# Patient Record
Sex: Female | Born: 1960
Health system: Southern US, Community
[De-identification: ages and names within clinical notes are randomized; demographics above are authoritative.]

## PROBLEM LIST (undated history)

## (undated) DIAGNOSIS — D219 Benign neoplasm of connective and other soft tissue, unspecified: Secondary | ICD-10-CM

## (undated) DIAGNOSIS — D649 Anemia, unspecified: Secondary | ICD-10-CM

## (undated) DIAGNOSIS — IMO0002 Reserved for concepts with insufficient information to code with codable children: Secondary | ICD-10-CM

## (undated) DIAGNOSIS — N952 Postmenopausal atrophic vaginitis: Secondary | ICD-10-CM

## (undated) DIAGNOSIS — N200 Calculus of kidney: Secondary | ICD-10-CM

## (undated) DIAGNOSIS — Z780 Asymptomatic menopausal state: Secondary | ICD-10-CM

## (undated) DIAGNOSIS — Z87442 Personal history of urinary calculi: Secondary | ICD-10-CM

## (undated) HISTORY — DX: Benign neoplasm of connective and other soft tissue, unspecified: D21.9

## (undated) HISTORY — DX: Reserved for concepts with insufficient information to code with codable children: IMO0002

## (undated) HISTORY — DX: Calculus of kidney: N20.0

## (undated) HISTORY — DX: Postmenopausal atrophic vaginitis: N95.2

## (undated) HISTORY — PX: LITHOTRIPSY: SUR834

## (undated) HISTORY — DX: Anemia, unspecified: D64.9

## (undated) HISTORY — DX: Asymptomatic menopausal state: Z78.0

---

## 2004-10-14 HISTORY — PX: ABDOMINAL HYSTERECTOMY: SHX81

## 2005-07-15 ENCOUNTER — Inpatient Hospital Stay: Payer: Self-pay | Admitting: Obstetrics and Gynecology

## 2005-10-11 ENCOUNTER — Emergency Department: Payer: Self-pay | Admitting: Emergency Medicine

## 2005-11-28 ENCOUNTER — Ambulatory Visit: Payer: Self-pay | Admitting: Urology

## 2006-01-02 ENCOUNTER — Ambulatory Visit: Payer: Self-pay | Admitting: Urology

## 2006-04-29 ENCOUNTER — Ambulatory Visit: Payer: Self-pay | Admitting: Urology

## 2006-04-30 ENCOUNTER — Ambulatory Visit: Payer: Self-pay | Admitting: Urology

## 2006-05-26 ENCOUNTER — Ambulatory Visit: Payer: Self-pay | Admitting: Urology

## 2006-06-18 ENCOUNTER — Ambulatory Visit: Payer: Self-pay | Admitting: Obstetrics and Gynecology

## 2006-07-01 ENCOUNTER — Ambulatory Visit: Payer: Self-pay | Admitting: Urology

## 2006-08-12 ENCOUNTER — Ambulatory Visit: Payer: Self-pay | Admitting: Urology

## 2006-09-17 ENCOUNTER — Ambulatory Visit: Payer: Self-pay | Admitting: Urology

## 2007-06-22 ENCOUNTER — Ambulatory Visit: Payer: Self-pay | Admitting: Obstetrics and Gynecology

## 2007-10-13 ENCOUNTER — Ambulatory Visit: Payer: Self-pay | Admitting: Urology

## 2007-10-15 LAB — HM PAP SMEAR

## 2008-06-22 ENCOUNTER — Ambulatory Visit: Payer: Self-pay | Admitting: Obstetrics and Gynecology

## 2009-06-26 ENCOUNTER — Ambulatory Visit: Payer: Self-pay | Admitting: Obstetrics and Gynecology

## 2009-07-05 ENCOUNTER — Ambulatory Visit: Payer: Self-pay | Admitting: Obstetrics and Gynecology

## 2010-07-10 ENCOUNTER — Ambulatory Visit: Payer: Self-pay | Admitting: Obstetrics and Gynecology

## 2011-08-01 ENCOUNTER — Ambulatory Visit: Payer: Self-pay | Admitting: Obstetrics and Gynecology

## 2012-08-04 ENCOUNTER — Ambulatory Visit: Payer: Self-pay | Admitting: Obstetrics and Gynecology

## 2013-08-11 ENCOUNTER — Ambulatory Visit: Payer: Self-pay | Admitting: Obstetrics and Gynecology

## 2014-08-17 ENCOUNTER — Ambulatory Visit: Payer: Self-pay | Admitting: Obstetrics and Gynecology

## 2014-08-17 LAB — HM MAMMOGRAPHY

## 2015-07-18 ENCOUNTER — Ambulatory Visit (INDEPENDENT_AMBULATORY_CARE_PROVIDER_SITE_OTHER): Payer: Self-pay | Admitting: Obstetrics and Gynecology

## 2015-07-18 ENCOUNTER — Encounter: Payer: Self-pay | Admitting: Obstetrics and Gynecology

## 2015-07-18 VITALS — BP 120/74 | HR 76 | Ht 63.0 in | Wt 126.0 lb

## 2015-07-18 DIAGNOSIS — Z1211 Encounter for screening for malignant neoplasm of colon: Secondary | ICD-10-CM

## 2015-07-18 DIAGNOSIS — Z780 Asymptomatic menopausal state: Secondary | ICD-10-CM

## 2015-07-18 DIAGNOSIS — Z01419 Encounter for gynecological examination (general) (routine) without abnormal findings: Secondary | ICD-10-CM

## 2015-07-18 DIAGNOSIS — N952 Postmenopausal atrophic vaginitis: Secondary | ICD-10-CM | POA: Insufficient documentation

## 2015-07-18 NOTE — Progress Notes (Signed)
ANNUAL PREVENTATIVE CARE GYN  ENCOUNTER NOTE  Subjective:       Lindsay Howell is a 54 y.o. G18P0020 female here for a routine annual gynecologic exam.  Current complaints: 1.  Annual-Patient needs mammogram and stool Guaiac card testing (patient desires to proceed with colonoscopy evaluation this year); Patient does not need Pap smear   Gynecologic History No LMP recorded. Patient has had a hysterectomy.TAH/BSO Contraception: hysterectomy (TAH-FIBROIDS) Last Pap: 2009, no further paps needed. Last mammogram: 08/17/2014 [facility] 1 negative  Obstetric History OB History  Gravida Para Term Preterm AB SAB TAB Ectopic Multiple Living  2    2 2         # Outcome Date GA Lbr Len/2nd Weight Sex Delivery Anes PTL Lv  2 SAB         FD  1 SAB         FD      Past Medical History  Diagnosis Date  . Fibroid   . Nephrolithiasis   . Anemia   . Menopause   . Vaginal atrophy   . Dyspareunia   . Postmenopausal atrophic vaginitis     Past Surgical History  Procedure Laterality Date  . Abdominal hysterectomy  2006  . Lithotripsy      Current Outpatient Prescriptions on File Prior to Visit  Medication Sig Dispense Refill  . Ospemifene (OSPHENA) 60 MG TABS Take by mouth.     No current facility-administered medications on file prior to visit.    Allergies no known allergies  Social History   Social History  . Marital Status: Married    Spouse Name: N/A  . Number of Children: N/A  . Years of Education: N/A   Occupational History  .  [employer]   Social History Main Topics  . Smoking status: Never Smoker   . Smokeless tobacco: Never Used  . Alcohol Use: 0.0 oz/week    0 Standard drinks or equivalent per week  . Drug Use: No  . Sexual Activity: Yes    Birth Control/ Protection: Post-menopausal   Other Topics Concern  . Not on file   Social History Narrative    Family History  Problem Relation Age of Onset  . Cancer Neg Hx   . Diabetes Neg Hx   . Heart disease Neg Hx      The following portions of the patient's history were reviewed and updated as appropriate: allergies, current medications, past family history, past medical history, past social history, past surgical history and problem list.  Review of Systems ROS Review of Systems - General ROS: negative for - chills, fatigue, fever, hot flashes, night sweats, weight gain or weight loss Psychological ROS: negative for - anxiety, decreased libido, depression, mood swings, physical abuse or sexual abuse Ophthalmic ROS: negative for - blurry vision, eye pain or loss of vision ENT ROS: negative for - headaches, hearing change, visual changes or vocal changes Allergy and Immunology ROS: negative for - hives, itchy/watery eyes or seasonal allergies Hematological and Lymphatic ROS: negative for - bleeding problems, bruising, swollen lymph nodes or weight loss Endocrine ROS: negative for - galactorrhea, hair pattern changes, hot flashes, malaise/lethargy, mood swings, palpitations, polydipsia/polyuria, skin changes, temperature intolerance or unexpected weight changes Breast ROS: negative for - new or changing breast lumps or nipple discharge Respiratory ROS: negative for - cough or shortness of breath Cardiovascular ROS: negative for - chest pain, irregular heartbeat, palpitations or shortness of breath Gastrointestinal ROS: no abdominal pain, change in bowel habits, or black  or bloody stools Genito-Urinary ROS: no dysuria, trouble voiding, or hematuria Musculoskeletal ROS: negative for - joint pain or joint stiffness Neurological ROS: negative for - bowel and bladder control changes Dermatological ROS: negative for rash and skin lesion changes   Objective:   BP 120/74 mmHg  Pulse 76  Ht 5\' 3"  (1.6 m)  Wt 126 lb (57.153 kg)  BMI 22.33 kg/m2 CONSTITUTIONAL: Well-developed, well-nourished female in no acute distress.  PSYCHIATRIC: Normal mood and affect. Normal behavior. Normal judgment and thought  content. [place]: Alert and oriented to person, place, and time. Normal muscle tone coordination. No cranial nerve deficit noted. HENT:  Normocephalic, atraumatic, External right and left ear normal. Oropharynx is clear and moist EYES: Conjunctivae and EOM are normal. Pupils are equal, round, and reactive to light. No scleral icterus.  NECK: Normal range of motion, supple, no masses.  Normal thyroid.  SKIN: Skin is warm and dry. No rash noted. Not diaphoretic. No erythema. No pallor. CARDIOVASCULAR: Normal heart rate noted, regular rhythm, no murmur. RESPIRATORY: Clear to auscultation bilaterally. Effort and breath sounds normal, no problems with respiration noted. BREASTS: Symmetric in size. No masses, skin changes, nipple drainage, or lymphadenopathy. ABDOMEN: Soft, normal bowel sounds, no distention noted.  No tenderness, rebound or guarding.  BLADDER: Normal PELVIC:  External Genitalia: Normal  BUS: Normal  Vagina: Mild atrophic changes; vaginal cuff intact and nontender  Cervix: Surgically absent  Uterus: Surgically absent  Adnexa: Nonpalpable, nontender  RV: External Exam NormaI, No Rectal Masses and Normal Sphincter tone  MUSCULOSKELETAL: Normal range of motion. No tenderness.  No cyanosis, clubbing, or edema.  2+ distal pulses. LYMPHATIC: No Axillary, Supraclavicular, or Inguinal Adenopathy.    Assessment:   Annual gynecologic examination 54 y.o. Contraception: hysterectomy TAH/BSO Normal BMI Vaginal atrophy, minimally symptomatic, controlled with lubricants  Plan:  Pap: 2009 no further paps needed Mammogram: 08/17/2014 Stool Guaiac Testing:  Patient to proceed with colonoscopy Labs: done at work for screening, pt states everything normal Routine preventative health maintenance measures emphasized: Exercise/Diet/Weight control, Tobacco Warnings and Alcohol/Substance use risks Lubricants for vaginal dryness, when necessary.  (Oliive oil, astro glide, JO H2O gel Continue  calcium with vitamin D supplementation Return to [place], [place]  Brayton Mars, MD

## 2015-07-18 NOTE — Patient Instructions (Signed)
1.  No Pap smear needed. 2.  Mammogram 3.  Patient is to proceed with colonoscopy screening. 4.  Continue with calcium with vitamin D supplementation. 5.  Return in 1 year

## 2015-08-25 ENCOUNTER — Other Ambulatory Visit: Payer: Self-pay | Admitting: Obstetrics and Gynecology

## 2015-08-25 ENCOUNTER — Ambulatory Visit
Admission: RE | Admit: 2015-08-25 | Discharge: 2015-08-25 | Disposition: A | Discharge status: Home / Self Care | Payer: BLUE CROSS/BLUE SHIELD | Source: Ambulatory Visit | Attending: Obstetrics and Gynecology | Admitting: Obstetrics and Gynecology

## 2015-08-25 DIAGNOSIS — Z01419 Encounter for gynecological examination (general) (routine) without abnormal findings: Secondary | ICD-10-CM

## 2015-08-25 DIAGNOSIS — Z1231 Encounter for screening mammogram for malignant neoplasm of breast: Secondary | ICD-10-CM | POA: Diagnosis not present

## 2016-01-03 IMAGING — MG MM SCREENING BREAST TOMO BILATERAL
9 of 12 series · 9 of 28 positions shown · non-contrast
Comparison: Previous exam(s).

CLINICAL DATA: Screening.

EXAM:
DIGITAL SCREENING BILATERAL MAMMOGRAM WITH 3D TOMO WITH CAD

[R MLO synth-2D]
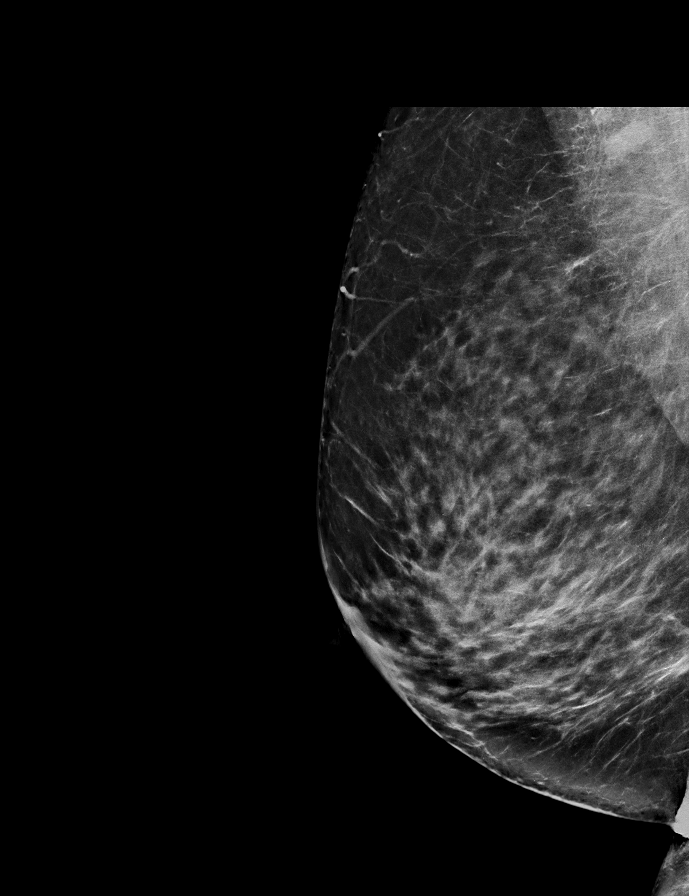

[R MLO]
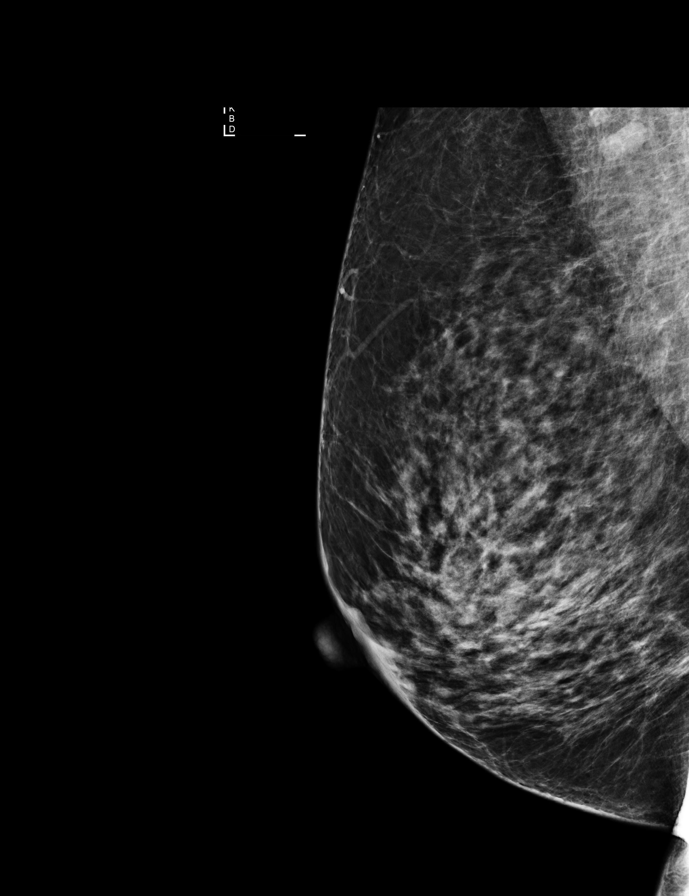

[R CC synth-2D]
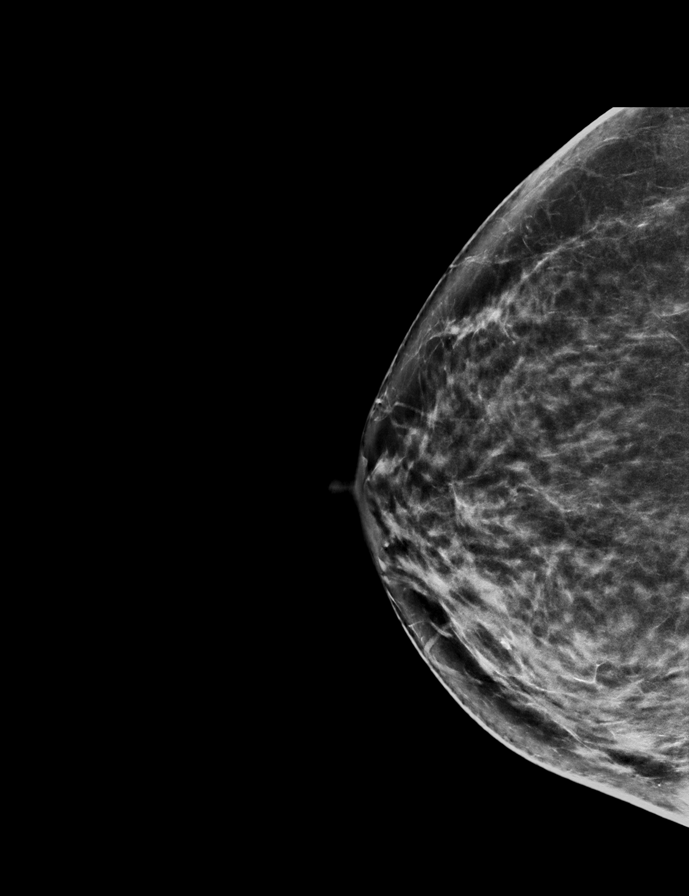

[R CC]
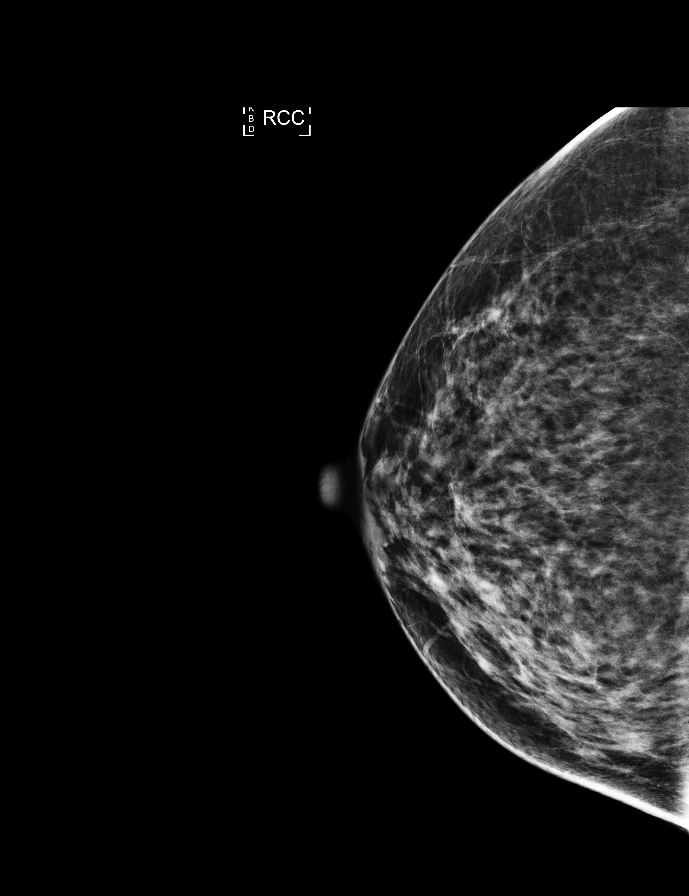

[L CC]
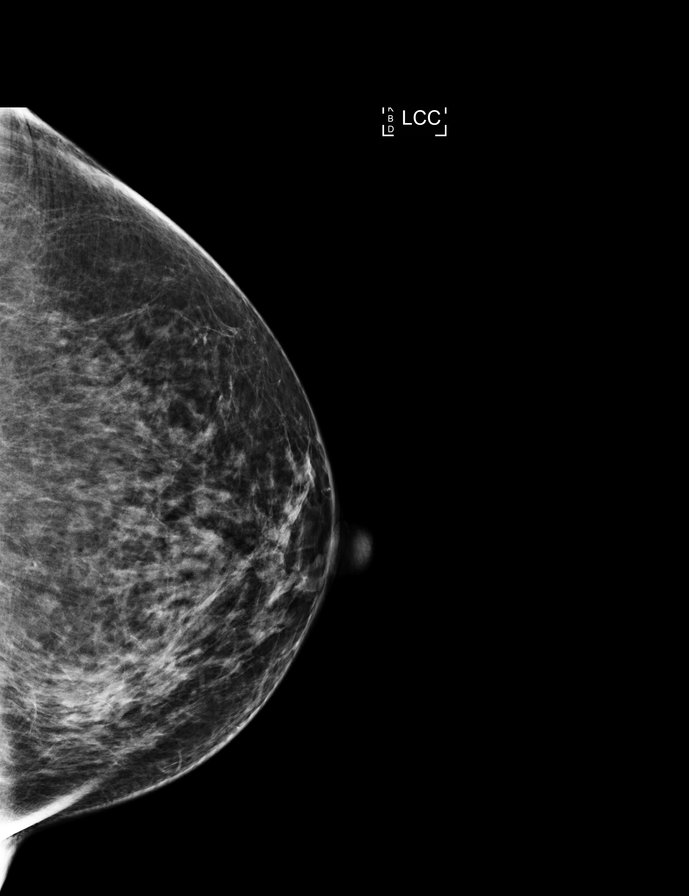

[L MLO]
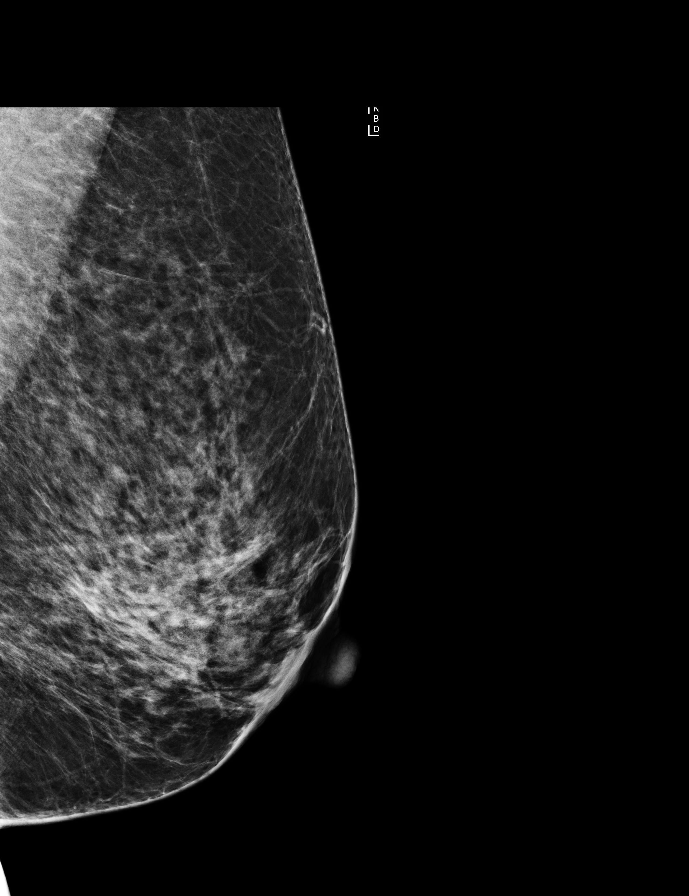

[L MLO synth-2D]
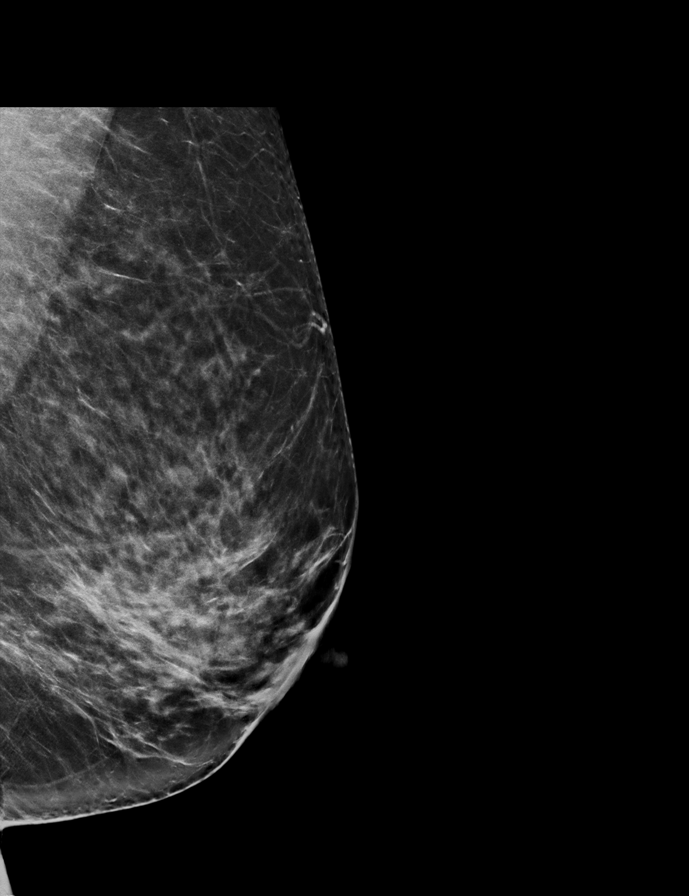

[L CC synth-2D]
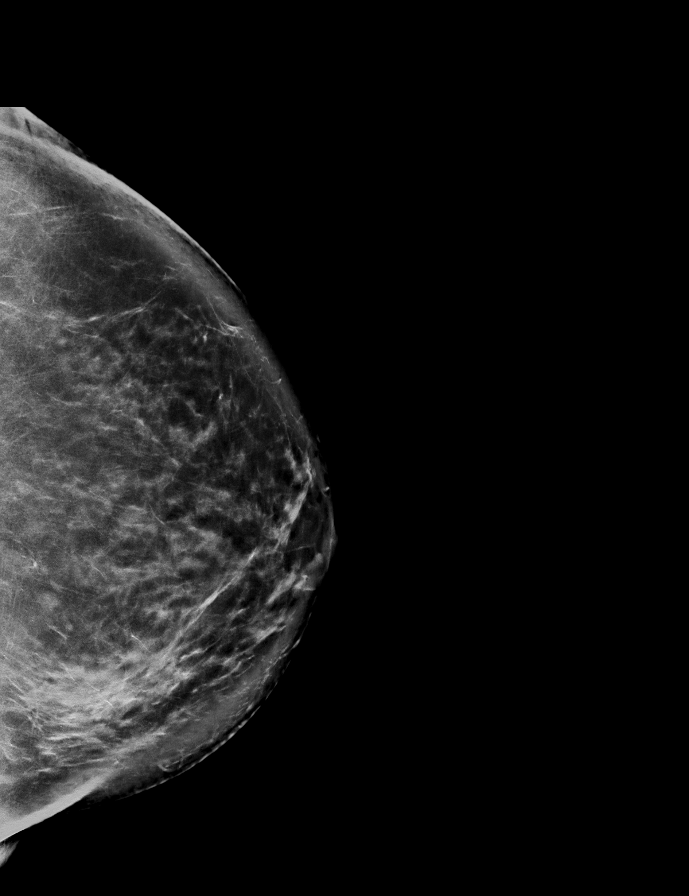

[R MLO tomo · tomo slice 39/78.0]
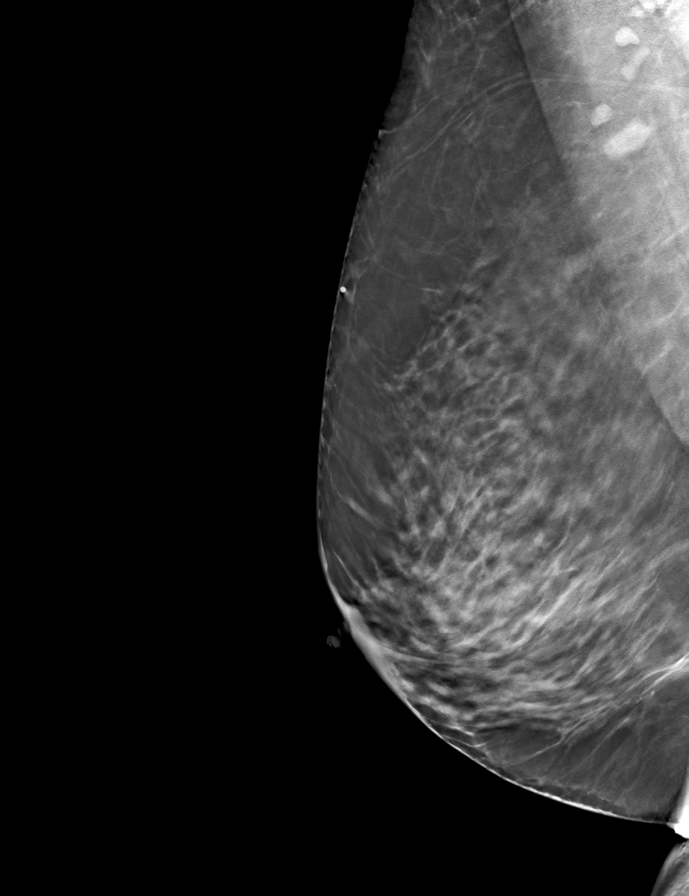

[9 of 28 positions shown; findings below may reference images not displayed]

ACR Breast Density Category c: The breast tissue is heterogeneously
dense, which may obscure small masses.
FINDINGS: There are no findings suspicious for malignancy. Images were
processed with CAD.
IMPRESSION: No mammographic evidence of malignancy. A result letter of this
screening mammogram will be mailed directly to the patient.

RECOMMENDATION:
Screening mammogram in one year. (Code:OA-G-1SS)

BI-RADS CATEGORY  1: Negative.

## 2016-07-22 NOTE — Progress Notes (Signed)
ANNUAL PREVENTATIVE CARE GYN  ENCOUNTER NOTE  Subjective:       Lindsay Howell is a 55 y.o. G16P0020 female here for a routine annual gynecologic exam.  Current complaints: 1.  Want to discuss starting osphena again- vaginal area raw   Gynecologic History No LMP recorded. Patient has had a hysterectomy. Contraception: status post hysterectomy- tah/bso Last Pap: no further paps needed. Results were: Last mammogram: 08/2015 birad 1. Results were: normal  Obstetric History OB History  Gravida Para Term Preterm AB Living  2       2    SAB TAB Ectopic Multiple Live Births  2            # Outcome Date GA Lbr Len/2nd Weight Sex Delivery Anes PTL Lv  2 SAB         FD  1 SAB         FD      Past Medical History:  Diagnosis Date  . Anemia   . Dyspareunia   . Fibroid   . Menopause   . Nephrolithiasis   . Postmenopausal atrophic vaginitis   . Vaginal atrophy     Past Surgical History:  Procedure Laterality Date  . ABDOMINAL HYSTERECTOMY  2006  . LITHOTRIPSY      Current Outpatient Prescriptions on File Prior to Visit  Medication Sig Dispense Refill  . Lactobacillus (ACIDOPHILUS) CAPS capsule Take by mouth.    . Ospemifene (OSPHENA) 60 MG TABS Take by mouth.     No current facility-administered medications on file prior to visit.     No Known Allergies  Social History   Social History  . Marital status: Married    Spouse name: N/A  . Number of children: N/A  . Years of education: N/A   Occupational History  .  [employer]   Social History Main Topics  . Smoking status: Never Smoker  . Smokeless tobacco: Never Used  . Alcohol use 0.0 oz/week  . Drug use: No  . Sexual activity: Yes    Birth control/ protection: Post-menopausal   Other Topics Concern  . Not on file   Social History Narrative  . No narrative on file    Family History  Problem Relation Age of Onset  . Cancer Neg Hx   . Diabetes Neg Hx   . Heart disease Neg Hx     The following portions of  the patient's history were reviewed and updated as appropriate: allergies, current medications, past family history, past medical history, past social history, past surgical history and problem list.  Review of Systems ROS Review of Systems - General ROS: negative for - chills, fatigue, fever, hot flashes, night sweats, weight gain or weight loss Psychological ROS: negative for - anxiety, decreased libido, depression, mood swings, physical abuse or sexual abuse Ophthalmic ROS: negative for - blurry vision, eye pain or loss of vision ENT ROS: negative for - headaches, hearing change, visual changes or vocal changes Allergy and Immunology ROS: negative for - hives, itchy/watery eyes or seasonal allergies Hematological and Lymphatic ROS: negative for - bleeding problems, bruising, swollen lymph nodes or weight loss Endocrine ROS: negative for - galactorrhea, hair pattern changes, hot flashes, malaise/lethargy, mood swings, palpitations, polydipsia/polyuria, skin changes, temperature intolerance or unexpected weight changes Breast ROS: negative for - new or changing breast lumps or nipple discharge Respiratory ROS: negative for - cough or shortness of breath Cardiovascular ROS: negative for - chest pain, irregular heartbeat, palpitations or shortness of breath Gastrointestinal  ROS: no abdominal pain, change in bowel habits, or black or bloody stools Genito-Urinary ROS: no dysuria, trouble voiding, or hematuria Musculoskeletal ROS: negative for - joint pain or joint stiffness Neurological ROS: negative for - bowel and bladder control changes Dermatological ROS: negative for rash and skin lesion changes   Objective:  BP 127/66   Pulse 70   Ht 5\' 3"  (1.6 m)   Wt 127 lb 9.6 oz (57.9 kg)   BMI 22.60 kg/m  CONSTITUTIONAL: Well-developed, well-nourished female in no acute distress.  PSYCHIATRIC: Normal mood and affect. Normal behavior. Normal judgment and thought content. [place]: Alert and  oriented to person, place, and time. Normal muscle tone coordination. No cranial nerve deficit noted. HENT:  Normocephalic, atraumatic, External right and left ear normal. Oropharynx is clear and moist EYES: Conjunctivae and EOM are normal.  No scleral icterus.  NECK: Normal range of motion, supple, no masses.  Normal thyroid.  SKIN: Skin is warm and dry. No rash noted. Not diaphoretic. No erythema. No pallor. CARDIOVASCULAR: Normal heart rate noted, regular rhythm, no murmur. RESPIRATORY: Clear to auscultation bilaterally. Effort and breath sounds normal, no problems with respiration noted. BREASTS: Symmetric in size. No masses, skin changes, nipple drainage, or lymphadenopathy. ABDOMEN: Soft, normal bowel sounds, no distention noted.  No tenderness, rebound or guarding.  BLADDER: Normal PELVIC:  External Genitalia: Normal  BUS: Normal  Vagina: Moderate atrophy; introitus snug, admitting single digit  Cervix: Surgically absent  Uterus: Surgically absent  Adnexa: Normal; nonpalpable nontender  RV: External Exam NormaI, No Rectal Masses and Normal Sphincter tone  MUSCULOSKELETAL: Normal range of motion. No tenderness.  No cyanosis, clubbing, or edema.  2+ distal pulses. LYMPHATIC: No Axillary, Supraclavicular, or Inguinal Adenopathy.    Assessment:   Annual gynecologic examination 55 y.o. Contraception: status post hysterectomy Normal BMI Problem List Items Addressed This Visit    Menopause   Vaginal atrophy    Other Visit Diagnoses    Well woman exam with routine gynecological exam    -  Primary   Screening for colon cancer         Vulvovaginal atrophy is symptomatic anemia; patient is counseled on dilator therapy and use of vaginal ERT Plan:  Pap: Not needed Mammogram: Ordered Stool Guaiac Testing:  Will have colonoscopy this year Labs: thru pcp Routine preventative health maintenance measures emphasized: Exercise/Diet/Weight control, Tobacco Warnings and Alcohol/Substance  use risks Start Premarin cream 1 g intravaginal twice a week Patient is to consider vaginal dilator therapy; www.vaginismus.com Return to [place], [place]  Brayton Mars, MD  Note: This dictation was prepared with Dragon dictation along with smaller phrase technology. Any transcriptional errors that result from this process are unintentional.

## 2016-07-23 ENCOUNTER — Encounter: Payer: BLUE CROSS/BLUE SHIELD | Admitting: Obstetrics and Gynecology

## 2016-07-24 ENCOUNTER — Ambulatory Visit (INDEPENDENT_AMBULATORY_CARE_PROVIDER_SITE_OTHER): Payer: BLUE CROSS/BLUE SHIELD | Admitting: Obstetrics and Gynecology

## 2016-07-24 ENCOUNTER — Encounter: Payer: Self-pay | Admitting: Obstetrics and Gynecology

## 2016-07-24 VITALS — BP 127/66 | HR 70 | Ht 63.0 in | Wt 127.6 lb

## 2016-07-24 DIAGNOSIS — Z90722 Acquired absence of ovaries, bilateral: Secondary | ICD-10-CM | POA: Diagnosis not present

## 2016-07-24 DIAGNOSIS — N952 Postmenopausal atrophic vaginitis: Secondary | ICD-10-CM | POA: Diagnosis not present

## 2016-07-24 DIAGNOSIS — Z90710 Acquired absence of both cervix and uterus: Secondary | ICD-10-CM | POA: Insufficient documentation

## 2016-07-24 DIAGNOSIS — Z1239 Encounter for other screening for malignant neoplasm of breast: Secondary | ICD-10-CM

## 2016-07-24 DIAGNOSIS — Z01419 Encounter for gynecological examination (general) (routine) without abnormal findings: Secondary | ICD-10-CM

## 2016-07-24 DIAGNOSIS — Z780 Asymptomatic menopausal state: Secondary | ICD-10-CM | POA: Diagnosis not present

## 2016-07-24 DIAGNOSIS — Z1211 Encounter for screening for malignant neoplasm of colon: Secondary | ICD-10-CM

## 2016-07-24 DIAGNOSIS — Z9079 Acquired absence of other genital organ(s): Secondary | ICD-10-CM | POA: Diagnosis not present

## 2016-07-24 DIAGNOSIS — N941 Unspecified dyspareunia: Secondary | ICD-10-CM

## 2016-07-24 DIAGNOSIS — Z1231 Encounter for screening mammogram for malignant neoplasm of breast: Secondary | ICD-10-CM | POA: Diagnosis not present

## 2016-07-24 NOTE — Patient Instructions (Signed)
1. No Pap smear is needed. 2. Mammogram is ordered. 3. Stool guaiac cards are not given. Patient will get colonoscopy this year 4. Healthy eating and exercise are to be continued 5. Calcium with vitamin D supplementation is encouraged daily 6. Trial of Premarin cream 1/2-1 g intravaginal is ordered 7. Vaginal dilator therapy website:  Www.vaginismus.com 8. Options for vaginal estrogen treatment:  Premarin cream 1 g intravaginal twice a week  Estrace cream 1 g intravaginal twice a week  Osphena 1 tablet daily  Estring intravaginal every 3 months 9. Return in 1 year

## 2016-07-30 ENCOUNTER — Telehealth: Payer: Self-pay | Admitting: Obstetrics and Gynecology

## 2016-07-30 MED ORDER — ESTROGENS, CONJUGATED 0.625 MG/GM VA CREA: 0.5000 | TOPICAL_CREAM | VAGINAL | 12 refills | Status: DC

## 2016-07-30 NOTE — Telephone Encounter (Signed)
This pt was told to call with RX info.  Send in [place]. [facility]

## 2016-07-30 NOTE — Telephone Encounter (Signed)
Med erx. Pt aware.  

## 2016-08-23 ENCOUNTER — Other Ambulatory Visit: Payer: Self-pay

## 2016-08-23 ENCOUNTER — Telehealth: Payer: Self-pay

## 2016-08-23 NOTE — Telephone Encounter (Signed)
Screening Colonoscopy Z12.11 [facility] 10/28/2016 Dr. Alvina Filbert Precert is not required

## 2016-08-23 NOTE — Telephone Encounter (Signed)
Gastroenterology Pre-Procedure Review  Request Date: 10/28/2016 Requesting Physician:   PATIENT REVIEW QUESTIONS: The patient responded to the following health history questions as indicated:    1. Are you having any GI issues? no 2. Do you have a personal history of Polyps? no 3. Do you have a family history of Colon Cancer or Polyps? no 4. Diabetes Mellitus? no 5. Joint replacements in the past 12 months?no 6. Major health problems in the past 3 months?no 7. Any artificial heart valves, MVP, or defibrillator?no    MEDICATIONS & ALLERGIES:    Patient reports the following regarding taking any anticoagulation/antiplatelet therapy:   Plavix, Coumadin, Eliquis, Xarelto, Lovenox, Pradaxa, Brilinta, or Effient? no Aspirin? no  Patient confirms/reports the following medications:  Current Outpatient Prescriptions  Medication Sig Dispense Refill  . conjugated estrogens (PREMARIN) vaginal cream Place 0.5 Applicatorfuls vaginally 2 (two) times a week. 1 gram vaginally twice weekly 42.5 g 12  . Lactobacillus (ACIDOPHILUS) CAPS capsule Take by mouth.     No current facility-administered medications for this visit.     Patient confirms/reports the following allergies:  No Known Allergies  No orders of the defined types were placed in this encounter.   AUTHORIZATION INFORMATION Primary Insurance: 1D#: Group #:  Secondary Insurance: 1D#: Group #:  SCHEDULE INFORMATION: Date: 10/28/2016 Time: Location: [place]

## 2016-08-26 ENCOUNTER — Ambulatory Visit
Admission: RE | Admit: 2016-08-26 | Discharge: 2016-08-26 | Disposition: A | Discharge status: Home / Self Care | Payer: BLUE CROSS/BLUE SHIELD | Source: Ambulatory Visit | Attending: Obstetrics and Gynecology | Admitting: Obstetrics and Gynecology

## 2016-08-26 ENCOUNTER — Encounter (INDEPENDENT_AMBULATORY_CARE_PROVIDER_SITE_OTHER): Payer: Self-pay

## 2016-08-26 DIAGNOSIS — Z1231 Encounter for screening mammogram for malignant neoplasm of breast: Secondary | ICD-10-CM | POA: Diagnosis not present

## 2016-08-26 DIAGNOSIS — Z1239 Encounter for other screening for malignant neoplasm of breast: Secondary | ICD-10-CM

## 2016-09-20 ENCOUNTER — Ambulatory Visit: Payer: BLUE CROSS/BLUE SHIELD

## 2016-10-04 DIAGNOSIS — J0100 Acute maxillary sinusitis, unspecified: Secondary | ICD-10-CM | POA: Diagnosis not present

## 2016-10-22 DIAGNOSIS — Z713 Dietary counseling and surveillance: Secondary | ICD-10-CM | POA: Diagnosis not present

## 2016-10-28 ENCOUNTER — Ambulatory Visit: Payer: BLUE CROSS/BLUE SHIELD | Admitting: Anesthesiology

## 2016-10-28 ENCOUNTER — Encounter: Payer: Self-pay | Admitting: *Deleted

## 2016-10-28 ENCOUNTER — Encounter
Admission: RE | Disposition: A | Discharge status: Home / Self Care | Payer: Self-pay | Source: Ambulatory Visit | Attending: Gastroenterology

## 2016-10-28 ENCOUNTER — Ambulatory Visit
Admission: RE | Admit: 2016-10-28 | Discharge: 2016-10-28 | Disposition: A | Discharge status: Home / Self Care | Payer: BLUE CROSS/BLUE SHIELD | Source: Ambulatory Visit | Attending: Gastroenterology | Admitting: Gastroenterology

## 2016-10-28 DIAGNOSIS — K648 Other hemorrhoids: Secondary | ICD-10-CM | POA: Diagnosis not present

## 2016-10-28 DIAGNOSIS — K219 Gastro-esophageal reflux disease without esophagitis: Secondary | ICD-10-CM | POA: Diagnosis not present

## 2016-10-28 DIAGNOSIS — Z79899 Other long term (current) drug therapy: Secondary | ICD-10-CM | POA: Diagnosis not present

## 2016-10-28 DIAGNOSIS — Z1211 Encounter for screening for malignant neoplasm of colon: Secondary | ICD-10-CM

## 2016-10-28 DIAGNOSIS — D649 Anemia, unspecified: Secondary | ICD-10-CM | POA: Diagnosis not present

## 2016-10-28 DIAGNOSIS — K640 First degree hemorrhoids: Secondary | ICD-10-CM | POA: Diagnosis not present

## 2016-10-28 HISTORY — DX: Personal history of urinary calculi: Z87.442

## 2016-10-28 HISTORY — PX: COLONOSCOPY WITH PROPOFOL: SHX5780

## 2016-10-28 SURGERY — COLONOSCOPY WITH PROPOFOL
Anesthesia: General

## 2016-10-28 MED ORDER — SODIUM CHLORIDE 0.9 % IV SOLN
INTRAVENOUS | Status: DC
  Administered 2016-10-28: 08:00:00 via INTRAVENOUS

## 2016-10-28 MED ORDER — MIDAZOLAM HCL 2 MG/2ML IJ SOLN
INTRAMUSCULAR | Status: AC
  Filled 2016-10-28: qty 2

## 2016-10-28 MED ORDER — PROPOFOL 500 MG/50ML IV EMUL
INTRAVENOUS | Status: AC
  Filled 2016-10-28: qty 50

## 2016-10-28 MED ORDER — PROPOFOL 500 MG/50ML IV EMUL
INTRAVENOUS | Status: DC | PRN
  Administered 2016-10-28: 125 ug/kg/min via INTRAVENOUS

## 2016-10-28 MED ORDER — LIDOCAINE HCL (PF) 1 % IJ SOLN
INTRAMUSCULAR | Status: AC
  Filled 2016-10-28: qty 5

## 2016-10-28 MED ORDER — PROPOFOL 10 MG/ML IV BOLUS
INTRAVENOUS | Status: DC | PRN
  Administered 2016-10-28: 50 mg via INTRAVENOUS

## 2016-10-28 MED ORDER — MIDAZOLAM HCL 2 MG/2ML IJ SOLN
INTRAMUSCULAR | Status: DC | PRN
  Administered 2016-10-28: 2 mg via INTRAVENOUS

## 2016-10-28 MED ORDER — LIDOCAINE HCL (PF) 2 % IJ SOLN
INTRAMUSCULAR | Status: DC | PRN
  Administered 2016-10-28: 60 mg via INTRADERMAL

## 2016-10-28 NOTE — Op Note (Signed)
[facility] Gastroenterology Patient Name: Lindsay Howell Procedure Date: 10/28/2016 7:20 AM MRN: [id number] Account #: [id number] Date of Birth: 06-Nov-1960 Admit Type: Outpatient Age: 56 Room: [facility] ENDO ROOM 4 Gender: Female Note Status: Finalized Procedure:            Colonoscopy Indications:          Screening for colorectal malignant neoplasm Providers:            Jonathon Bellows MD, MD Referring MD:         Alanda Slim. Defrancesco, MD (Referring MD) Medicines:            Monitored Anesthesia Care Complications:        No immediate complications. Procedure:            Pre-Anesthesia Assessment:                       - Prior to the procedure, a History and Physical was                        performed, and patient medications, allergies and                        sensitivities were reviewed. The patient's tolerance of                        previous anesthesia was reviewed.                       - The risks and benefits of the procedure and the                        sedation options and risks were discussed with the                        patient. All questions were answered and informed                        consent was obtained.                       - The risks and benefits of the procedure and the                        sedation options and risks were discussed with the                        patient. All questions were answered and informed                        consent was obtained.                       - ASA Grade Assessment: III - A patient with severe                        systemic disease.                       After obtaining informed consent, the colonoscope was  passed under direct vision. Throughout the procedure,                        the patient's blood pressure, pulse, and oxygen                        saturations were monitored continuously. The                        Colonoscope was introduced through the anus and                         advanced to the the cecum, identified by the                        appendiceal orifice, IC valve and transillumination.                        The colonoscopy was performed with ease. The patient                        tolerated the procedure well. The quality of the bowel                        preparation was excellent. Findings:      The perianal and digital rectal examinations were normal.      Non-bleeding internal hemorrhoids were found during retroflexion. The       hemorrhoids were small and Grade I (internal hemorrhoids that do not       prolapse).      The exam was otherwise without abnormality. Impression:           - Non-bleeding internal hemorrhoids.                       - The examination was otherwise normal.                       - No specimens collected. Recommendation:       - Patient has a contact number available for                        emergencies. The signs and symptoms of potential                        delayed complications were discussed with the patient.                        Return to normal activities tomorrow. Written discharge                        instructions were provided to the patient.                       - Resume previous diet.                       - Continue present medications.                       - Repeat colonoscopy in 10 years for screening purposes.                       -  Discharge patient to home (with escort). Procedure Code(s):    --- Professional ---                       [id number], Colorectal cancer screening; colonoscopy on                        individual not meeting criteria for high risk CPT copyright 2016 American Medical Association. All rights reserved. The codes documented in this report are preliminary and upon coder review may  be revised to meet current compliance requirements. Jonathon Bellows, MD Jonathon Bellows MD, MD 10/28/2016 8:15:24 AM This report has been signed electronically. Number of Addenda:  0 Note Initiated On: 10/28/2016 7:20 AM Scope Withdrawal Time: 0 hours 11 minutes 39 seconds  Total Procedure Duration: 0 hours 14 minutes 8 seconds       [facility]

## 2016-10-28 NOTE — H&P (Signed)
  Jonathon Bellows MD [address] [place], [place] [address] Phone: [phone] Fax : [phone]  Primary Care Physician:  Brayton Mars, MD Primary Gastroenterologist:  Dr. Jonathon Bellows   Pre-Procedure History & Physical: HPI:  Lindsay Howell is a 56 y.o. female is here for an colonoscopy.   Past Medical History:  Diagnosis Date  . Anemia   . Dyspareunia   . Fibroid   . History of kidney stones   . Menopause   . Nephrolithiasis   . Postmenopausal atrophic vaginitis   . Vaginal atrophy     Past Surgical History:  Procedure Laterality Date  . ABDOMINAL HYSTERECTOMY  2006  . LITHOTRIPSY      Prior to Admission medications   Medication Sig Start Date End Date Taking? Authorizing Provider  Lactobacillus (ACIDOPHILUS) CAPS capsule Take by mouth.   Yes Historical Provider, MD  conjugated estrogens (PREMARIN) vaginal cream Place 0.5 Applicatorfuls vaginally 2 (two) times a week. 1 gram vaginally twice weekly 08/01/16   Brayton Mars, MD    Allergies as of 08/23/2016  . (No Known Allergies)    Family History  Problem Relation Age of Onset  . Cancer Neg Hx   . Diabetes Neg Hx   . Heart disease Neg Hx   . Breast cancer Neg Hx     Social History   Social History  . Marital status: Married    Spouse name: N/A  . Number of children: N/A  . Years of education: N/A   Occupational History  .  [employer]   Social History Main Topics  . Smoking status: Never Smoker  . Smokeless tobacco: Never Used  . Alcohol use 0.0 oz/week     Comment: socially  . Drug use: No  . Sexual activity: Yes    Birth control/ protection: Post-menopausal   Other Topics Concern  . Not on file   Social History Narrative  . No narrative on file    Review of Systems: See HPI, otherwise negative ROS  Physical Exam: BP 130/69   Pulse 74   Temp 98.1 F (36.7 C) (Tympanic)   Resp 18   Ht 5\' 3"  (1.6 m)   Wt 127 lb (57.6 kg)   SpO2 100%   BMI 22.50 kg/m  General:    Alert,  pleasant and cooperative in NAD Head:  Normocephalic and atraumatic. Neck:  Supple; no masses or thyromegaly. Lungs:  Clear throughout to auscultation.    Heart:  Regular rate and rhythm. Abdomen:  Soft, nontender and nondistended. Normal bowel sounds, without guarding, and without rebound.   Neurologic:  Alert and  oriented x4;  grossly normal neurologically.  Impression/Plan: Lindsay Howell is here for an colonoscopy to be performed for Screening colonoscopy average risk   Risks, benefits, limitations, and alternatives regarding  colonoscopy have been reviewed with the patient.  Questions have been answered.  All parties agreeable.   Jonathon Bellows, MD  10/28/2016, 7:54 AM

## 2016-10-28 NOTE — Transfer of Care (Signed)
Immediate Anesthesia Transfer of Care Note  Patient: Lindsay Howell  Procedure(s) Performed: Procedure(s): COLONOSCOPY WITH PROPOFOL (N/A)  Patient Location: PACU  Anesthesia Type:General  Level of Consciousness: awake and responds to stimulation  Airway & Oxygen Therapy: Patient Spontanous Breathing and Patient connected to nasal cannula oxygen  Post-op Assessment: Report given to RN and Post -op Vital signs reviewed and stable  Post vital signs: Reviewed and stable  Last Vitals:  Vitals:   10/28/16 0727  BP: 130/69  Pulse: 74  Resp: 18  Temp: 36.7 C    Last Pain:  Vitals:   10/28/16 0727  TempSrc: Tympanic         Complications: No apparent anesthesia complications

## 2016-10-28 NOTE — Anesthesia Preprocedure Evaluation (Signed)
Anesthesia Evaluation  Patient identified by MRN, date of birth, ID band Patient awake    Reviewed: Allergy & Precautions, H&P , NPO status , Patient's Chart, lab work & pertinent test results  History of Anesthesia Complications Negative for: history of anesthetic complications  Airway Mallampati: I  TM Distance: >3 FB Neck ROM: full    Dental  (+) Poor Dentition, Caps   Pulmonary neg pulmonary ROS, neg shortness of breath,    Pulmonary exam normal breath sounds clear to auscultation       Cardiovascular Exercise Tolerance: Good (-) angina(-) Past MI and (-) DOE negative cardio ROS Normal cardiovascular exam Rhythm:regular Rate:Normal     Neuro/Psych negative neurological ROS  negative psych ROS   GI/Hepatic Neg liver ROS, GERD  Controlled,  Endo/Other  negative endocrine ROS  Renal/GU Renal disease  negative genitourinary   Musculoskeletal   Abdominal   Peds  Hematology negative hematology ROS (+)   Anesthesia Other Findings Past Medical History: No date: Anemia No date: Dyspareunia No date: Fibroid No date: History of kidney stones No date: Menopause No date: Nephrolithiasis No date: Postmenopausal atrophic vaginitis No date: Vaginal atrophy  Past Surgical History: 2006: ABDOMINAL HYSTERECTOMY No date: LITHOTRIPSY  BMI    Body Mass Index:  22.50 kg/m      Reproductive/Obstetrics negative OB ROS                             Anesthesia Physical Anesthesia Plan  ASA: III  Anesthesia Plan: General   Post-op Pain Management:    Induction:   Airway Management Planned:   Additional Equipment:   Intra-op Plan:   Post-operative Plan:   Informed Consent: I have reviewed the patients History and Physical, chart, labs and discussed the procedure including the risks, benefits and alternatives for the proposed anesthesia with the patient or authorized representative who  has indicated his/her understanding and acceptance.   Dental Advisory Given  Plan Discussed with: Anesthesiologist, CRNA and Surgeon  Anesthesia Plan Comments:         Anesthesia Quick Evaluation

## 2016-10-28 NOTE — Anesthesia Procedure Notes (Signed)
Performed by: Geralene Afshar Pre-anesthesia Checklist: Patient identified, Emergency Drugs available, Suction available, Patient being monitored and Timeout performed Patient Re-evaluated:Patient Re-evaluated prior to inductionOxygen Delivery Method: Nasal cannula Preoxygenation: Pre-oxygenation with 100% oxygen Intubation Type: IV induction       

## 2016-10-28 NOTE — Anesthesia Postprocedure Evaluation (Signed)
Anesthesia Post Note  Patient: Vivian Swartzentruber  Procedure(s) Performed: Procedure(s) (LRB): COLONOSCOPY WITH PROPOFOL (N/A)  Patient location during evaluation: Endoscopy Anesthesia Type: General Level of consciousness: awake and alert Pain management: pain level controlled Vital Signs Assessment: post-procedure vital signs reviewed and stable Respiratory status: spontaneous breathing, nonlabored ventilation, respiratory function stable and patient connected to nasal cannula oxygen Cardiovascular status: blood pressure returned to baseline and stable Postop Assessment: no signs of nausea or vomiting Anesthetic complications: no     Last Vitals:  Vitals:   10/28/16 0836 10/28/16 0846  BP: 106/64 109/73  Pulse: 68 65  Resp: (!) 23 17  Temp:      Last Pain:  Vitals:   10/28/16 0816  TempSrc: Tympanic                 Precious Haws Madelena Maturin

## 2016-10-29 ENCOUNTER — Encounter: Payer: Self-pay | Admitting: Gastroenterology

## 2017-06-18 DIAGNOSIS — H43811 Vitreous degeneration, right eye: Secondary | ICD-10-CM | POA: Diagnosis not present

## 2017-06-18 DIAGNOSIS — H43393 Other vitreous opacities, bilateral: Secondary | ICD-10-CM | POA: Diagnosis not present

## 2017-07-28 NOTE — Progress Notes (Signed)
ANNUAL PREVENTATIVE CARE GYN  ENCOUNTER NOTE  Subjective:       Lindsay Howell is a 56 y.o. G75P0020 female here for a routine annual gynecologic exam.  Current complaints: 1. Doesn't like premarin cream- too messy  No major interval health issues. Bowel function and bladder function are normal Patient is still having some discomfort with intercourse.   Gynecologic History No LMP recorded. Patient has had a hysterectomy. Contraception: status post hysterectomy- tah/bso Last Pap: no further paps needed. Results were: Last mammogram: 08/2016 birad 1. Results were: normal  Obstetric History OB History  Gravida Para Term Preterm AB Living  2       2    SAB TAB Ectopic Multiple Live Births  2            # Outcome Date GA Lbr Len/2nd Weight Sex Delivery Anes PTL Lv  2 SAB         FD  1 SAB         FD      Past Medical History:  Diagnosis Date  . Anemia   . Dyspareunia   . Fibroid   . History of kidney stones   . Menopause   . Nephrolithiasis   . Postmenopausal atrophic vaginitis   . Vaginal atrophy     Past Surgical History:  Procedure Laterality Date  . ABDOMINAL HYSTERECTOMY  2006  . COLONOSCOPY WITH PROPOFOL N/A 10/28/2016   Procedure: COLONOSCOPY WITH PROPOFOL;  Surgeon: Jonathon Bellows, MD;  Location: [facility] ENDOSCOPY;  Service: Endoscopy;  Laterality: N/A;  . LITHOTRIPSY      Current Outpatient Prescriptions on File Prior to Visit  Medication Sig Dispense Refill  . conjugated estrogens (PREMARIN) vaginal cream Place 0.5 Applicatorfuls vaginally 2 (two) times a week. 1 gram vaginally twice weekly 42.5 g 12  . Lactobacillus (ACIDOPHILUS) CAPS capsule Take by mouth.     No current facility-administered medications on file prior to visit.     No Known Allergies  Social History   Social History  . Marital status: Married    Spouse name: N/A  . Number of children: N/A  . Years of education: N/A   Occupational History  .  [employer]   Social History Main Topics  .  Smoking status: Never Smoker  . Smokeless tobacco: Never Used  . Alcohol use 0.0 oz/week     Comment: socially  . Drug use: No  . Sexual activity: Yes    Birth control/ protection: Post-menopausal   Other Topics Concern  . Not on file   Social History Narrative  . No narrative on file    Family History  Problem Relation Age of Onset  . Cancer Neg Hx   . Diabetes Neg Hx   . Heart disease Neg Hx   . Breast cancer Neg Hx     The following portions of the patient's history were reviewed and updated as appropriate: allergies, current medications, past family history, past medical history, past social history, past surgical history and problem list.  Review of Systems Review of Systems  Constitutional: Negative.        No significant vasomotor symptoms  HENT: Negative.   Eyes: Negative.   Respiratory: Negative.   Cardiovascular: Negative.   Gastrointestinal: Negative.   Genitourinary: Negative.        Mild dyspareunia process Vaginal dryness persists  Musculoskeletal: Negative.   Skin: Negative.   Neurological: Negative.   Endo/Heme/Allergies: Negative.   Psychiatric/Behavioral: Negative.    anges  Objective:  BP 99/61   Pulse 72   Ht 5\' 3"  (1.6 m)   Wt 127 lb 9.6 oz (57.9 kg)   BMI 22.60 kg/m  CONSTITUTIONAL: Well-developed, well-nourished female in no acute distress.  PSYCHIATRIC: Normal mood and affect. Normal behavior. Normal judgment and thought content. [place]: Alert and oriented to person, place, and time. Normal muscle tone coordination. No cranial nerve deficit noted. HENT:  Normocephalic, atraumatic, External right and left ear normal. Oropharynx is clear and moist EYES: Conjunctivae and EOM are normal.  No scleral icterus.  NECK: Normal range of motion, supple, no masses.  Normal thyroid.  SKIN: Skin is warm and dry. No rash noted. Not diaphoretic. No erythema. No pallor. CARDIOVASCULAR: Normal heart rate noted, regular rhythm, no  murmur. RESPIRATORY: Clear to auscultation bilaterally. Effort and breath sounds normal, no problems with respiration noted. BREASTS: Symmetric in size. No masses, skin changes, nipple drainage, or lymphadenopathy. ABDOMEN: Soft, normal bowel sounds, no distention noted.  No tenderness, rebound or guarding.  BLADDER: Normal PELVIC:  External Genitalia: Normal  BUS: Normal  Vagina:Mild to Moderate atrophy; introitus snug, admitting single digit  Cervix: Surgically absent  Uterus: Surgically absent  Adnexa: Normal; nonpalpable nontender  RV: External Exam NormaI, No Rectal Masses and Normal Sphincter tone  MUSCULOSKELETAL: Normal range of motion. No tenderness.  No cyanosis, clubbing, or edema.  2+ distal pulses. LYMPHATIC: No Axillary, Supraclavicular, or Inguinal Adenopathy.    Assessment:   Annual gynecologic examination 56 y.o. Contraception: status post hysterectomy Normal BMI Problem List Items Addressed This Visit    Menopause   Vaginal atrophy   Status post TAH-BSO    Other Visit Diagnoses    Well woman exam with routine gynecological exam    -  Primary   Screening for breast cancer       Screen for colon cancer       Dyspareunia, female         Vulvovaginal atrophy is symptomatic;Desires alternative topical estrogen therapy Plan:  Pap: Not needed Mammogram: Ordered Stool Guaiac Testing: colonoscopy 10/2016 Labs: thru pcp Routine preventative health maintenance measures emphasized: Exercise/Diet/Weight control, Tobacco Warnings and Alcohol/Substance use risks Stopped Premarin cream Trial of Intrarosa vaginal suppositories nightly Return to [place], CMA  Brayton Mars, MD  Note: This dictation was prepared with Dragon dictation along with smaller phrase technology. Any transcriptional errors that result from this process are unintentional.

## 2017-07-30 ENCOUNTER — Encounter: Payer: Self-pay | Admitting: Obstetrics and Gynecology

## 2017-07-30 ENCOUNTER — Ambulatory Visit (INDEPENDENT_AMBULATORY_CARE_PROVIDER_SITE_OTHER): Payer: BLUE CROSS/BLUE SHIELD | Admitting: Obstetrics and Gynecology

## 2017-07-30 VITALS — BP 99/61 | HR 72 | Ht 63.0 in | Wt 127.6 lb

## 2017-07-30 DIAGNOSIS — N941 Unspecified dyspareunia: Secondary | ICD-10-CM

## 2017-07-30 DIAGNOSIS — Z90710 Acquired absence of both cervix and uterus: Secondary | ICD-10-CM

## 2017-07-30 DIAGNOSIS — Z90722 Acquired absence of ovaries, bilateral: Secondary | ICD-10-CM | POA: Diagnosis not present

## 2017-07-30 DIAGNOSIS — Z1231 Encounter for screening mammogram for malignant neoplasm of breast: Secondary | ICD-10-CM | POA: Diagnosis not present

## 2017-07-30 DIAGNOSIS — N952 Postmenopausal atrophic vaginitis: Secondary | ICD-10-CM

## 2017-07-30 DIAGNOSIS — Z9079 Acquired absence of other genital organ(s): Secondary | ICD-10-CM

## 2017-07-30 DIAGNOSIS — Z780 Asymptomatic menopausal state: Secondary | ICD-10-CM

## 2017-07-30 DIAGNOSIS — Z1211 Encounter for screening for malignant neoplasm of colon: Secondary | ICD-10-CM | POA: Diagnosis not present

## 2017-07-30 DIAGNOSIS — Z1239 Encounter for other screening for malignant neoplasm of breast: Secondary | ICD-10-CM

## 2017-07-30 DIAGNOSIS — Z01419 Encounter for gynecological examination (general) (routine) without abnormal findings: Secondary | ICD-10-CM | POA: Diagnosis not present

## 2017-07-30 NOTE — Patient Instructions (Signed)
1. No Pap smear needed 2. Mammogram ordered 3. Stool guaiac cards are not given because of recent colonoscopy 4. Screening labs obtained for primary care 5. Continue with healthy eating and exercise 6. Trial of Intrarosa suppositories for vaginal dryness and dyspareunia are given 7. Return in 1 year   Health Maintenance for Postmenopausal Women Menopause is a normal process in which your reproductive ability comes to an end. This process happens gradually over a span of months to years, usually between the ages of 10 and 26. Menopause is complete when you have missed 12 consecutive menstrual periods. It is important to talk with your health care provider about some of the most common conditions that affect postmenopausal women, such as heart disease, cancer, and bone loss (osteoporosis). Adopting a healthy lifestyle and getting preventive care can help to promote your health and wellness. Those actions can also lower your chances of developing some of these common conditions. What should I know about menopause? During menopause, you may experience a number of symptoms, such as:  Moderate-to-severe hot flashes.  Night sweats.  Decrease in sex drive.  Mood swings.  Headaches.  Tiredness.  Irritability.  Memory problems.  Insomnia.  Choosing to treat or not to treat menopausal changes is an individual decision that you make with your health care provider. What should I know about hormone replacement therapy and supplements? Hormone therapy products are effective for treating symptoms that are associated with menopause, such as hot flashes and night sweats. Hormone replacement carries certain risks, especially as you become older. If you are thinking about using estrogen or estrogen with progestin treatments, discuss the benefits and risks with your health care provider. What should I know about heart disease and stroke? Heart disease, heart attack, and stroke become more likely as  you age. This may be due, in part, to the hormonal changes that your body experiences during menopause. These can affect how your body processes dietary fats, triglycerides, and cholesterol. Heart attack and stroke are both medical emergencies. There are many things that you can do to help prevent heart disease and stroke:  Have your blood pressure checked at least every 1-2 years. High blood pressure causes heart disease and increases the risk of stroke.  If you are 61-69 years old, ask your health care provider if you should take aspirin to prevent a heart attack or a stroke.  Do not use any tobacco products, including cigarettes, chewing tobacco, or electronic cigarettes. If you need help quitting, ask your health care provider.  It is important to eat a healthy diet and maintain a healthy weight. ? Be sure to include plenty of vegetables, fruits, low-fat dairy products, and lean protein. ? Avoid eating foods that are high in solid fats, added sugars, or salt (sodium).  Get regular exercise. This is one of the most important things that you can do for your health. ? Try to exercise for at least 150 minutes each week. The type of exercise that you do should increase your heart rate and make you sweat. This is known as moderate-intensity exercise. ? Try to do strengthening exercises at least twice each week. Do these in addition to the moderate-intensity exercise.  Know your numbers.Ask your health care provider to check your cholesterol and your blood glucose. Continue to have your blood tested as directed by your health care provider.  What should I know about cancer screening? There are several types of cancer. Take the following steps to reduce your risk and  to catch any cancer development as early as possible. Breast Cancer  Practice breast self-awareness. ? This means understanding how your breasts normally appear and feel. ? It also means doing regular breast self-exams. Let your  health care provider know about any changes, no matter how small.  If you are 60 or older, have a clinician do a breast exam (clinical breast exam or CBE) every year. Depending on your age, family history, and medical history, it may be recommended that you also have a yearly breast X-ray (mammogram).  If you have a family history of breast cancer, talk with your health care provider about genetic screening.  If you are at high risk for breast cancer, talk with your health care provider about having an MRI and a mammogram every year.  Breast cancer (BRCA) gene test is recommended for women who have family members with BRCA-related cancers. Results of the assessment will determine the need for genetic counseling and BRCA1 and for BRCA2 testing. BRCA-related cancers include these types: ? Breast. This occurs in males or females. ? Ovarian. ? Tubal. This may also be called fallopian tube cancer. ? Cancer of the abdominal or pelvic lining (peritoneal cancer). ? Prostate. ? Pancreatic.  Cervical, Uterine, and Ovarian Cancer Your health care provider may recommend that you be screened regularly for cancer of the pelvic organs. These include your ovaries, uterus, and vagina. This screening involves a pelvic exam, which includes checking for microscopic changes to the surface of your cervix (Pap test).  For women ages 21-65, health care providers may recommend a pelvic exam and a Pap test every three years. For women ages 27-65, they may recommend the Pap test and pelvic exam, combined with testing for human papilloma virus (HPV), every five years. Some types of HPV increase your risk of cervical cancer. Testing for HPV may also be done on women of any age who have unclear Pap test results.  Other health care providers may not recommend any screening for nonpregnant women who are considered low risk for pelvic cancer and have no symptoms. Ask your health care provider if a screening pelvic exam is right  for you.  If you have had past treatment for cervical cancer or a condition that could lead to cancer, you need Pap tests and screening for cancer for at least 20 years after your treatment. If Pap tests have been discontinued for you, your risk factors (such as having a new sexual partner) need to be reassessed to determine if you should start having screenings again. Some women have medical problems that increase the chance of getting cervical cancer. In these cases, your health care provider may recommend that you have screening and Pap tests more often.  If you have a family history of uterine cancer or ovarian cancer, talk with your health care provider about genetic screening.  If you have vaginal bleeding after reaching menopause, tell your health care provider.  There are currently no reliable tests available to screen for ovarian cancer.  Lung Cancer Lung cancer screening is recommended for adults 80-16 years old who are at high risk for lung cancer because of a history of smoking. A yearly low-dose CT scan of the lungs is recommended if you:  Currently smoke.  Have a history of at least 30 pack-years of smoking and you currently smoke or have quit within the past 15 years. A pack-year is smoking an average of one pack of cigarettes per day for one year.  Yearly screening should:  Continue until it has been 15 years since you quit.  Stop if you develop a health problem that would prevent you from having lung cancer treatment.  Colorectal Cancer  This type of cancer can be detected and can often be prevented.  Routine colorectal cancer screening usually begins at age 73 and continues through age 33.  If you have risk factors for colon cancer, your health care provider may recommend that you be screened at an earlier age.  If you have a family history of colorectal cancer, talk with your health care provider about genetic screening.  Your health care provider may also  recommend using home test kits to check for hidden blood in your stool.  A small camera at the end of a tube can be used to examine your colon directly (sigmoidoscopy or colonoscopy). This is done to check for the earliest forms of colorectal cancer.  Direct examination of the colon should be repeated every 5-10 years until age 57. However, if early forms of precancerous polyps or small growths are found or if you have a family history or genetic risk for colorectal cancer, you may need to be screened more often.  Skin Cancer  Check your skin from head to toe regularly.  Monitor any moles. Be sure to tell your health care provider: ? About any new moles or changes in moles, especially if there is a change in a mole's shape or color. ? If you have a mole that is larger than the size of a pencil eraser.  If any of your family members has a history of skin cancer, especially at a young age, talk with your health care provider about genetic screening.  Always use sunscreen. Apply sunscreen liberally and repeatedly throughout the day.  Whenever you are outside, protect yourself by wearing long sleeves, pants, a wide-brimmed hat, and sunglasses.  What should I know about osteoporosis? Osteoporosis is a condition in which bone destruction happens more quickly than new bone creation. After menopause, you may be at an increased risk for osteoporosis. To help prevent osteoporosis or the bone fractures that can happen because of osteoporosis, the following is recommended:  If you are 21-2 years old, get at least 1,000 mg of calcium and at least 600 mg of vitamin D per day.  If you are older than age 88 but younger than age 6, get at least 1,200 mg of calcium and at least 600 mg of vitamin D per day.  If you are older than age 34, get at least 1,200 mg of calcium and at least 800 mg of vitamin D per day.  Smoking and excessive alcohol intake increase the risk of osteoporosis. Eat foods that are  rich in calcium and vitamin D, and do weight-bearing exercises several times each week as directed by your health care provider. What should I know about how menopause affects my mental health? Depression may occur at any age, but it is more common as you become older. Common symptoms of depression include:  Low or sad mood.  Changes in sleep patterns.  Changes in appetite or eating patterns.  Feeling an overall lack of motivation or enjoyment of activities that you previously enjoyed.  Frequent crying spells.  Talk with your health care provider if you think that you are experiencing depression. What should I know about immunizations? It is important that you get and maintain your immunizations. These include:  Tetanus, diphtheria, and pertussis (Tdap) booster vaccine.  Influenza every year before the  flu season begins.  Pneumonia vaccine.  Shingles vaccine.  Your health care provider may also recommend other immunizations. This information is not intended to replace advice given to you by your health care provider. Make sure you discuss any questions you have with your health care provider. Document Released: 11/22/2005 Document Revised: 04/19/2016 Document Reviewed: 07/04/2015 Elsevier Interactive Patient Education  2018 [employer].

## 2017-08-14 ENCOUNTER — Telehealth: Payer: Self-pay | Admitting: Obstetrics and Gynecology

## 2017-08-14 NOTE — Telephone Encounter (Signed)
Patient called and lvm stating that she  Would like to speak with Dr. Tennis Must or Joyice Faster in regards to the "Interosa" Medication that has/needs to be prescribed. No other information was disclosed other than wanting to receive a call back. Please advise.

## 2017-08-15 MED ORDER — PRASTERONE 6.5 MG VA INST: 6.5000 mg | VAGINAL_INSERT | Freq: Every day | VAGINAL | 11 refills | Status: DC

## 2017-08-15 NOTE — Telephone Encounter (Signed)
Pt wants a rx of intrarosa. Has used x 14 days.  Seems to be helping. Erx.

## 2017-09-01 ENCOUNTER — Ambulatory Visit
Admission: RE | Admit: 2017-09-01 | Discharge: 2017-09-01 | Disposition: A | Discharge status: Home / Self Care | Payer: BLUE CROSS/BLUE SHIELD | Source: Ambulatory Visit | Attending: Obstetrics and Gynecology | Admitting: Obstetrics and Gynecology

## 2017-09-01 DIAGNOSIS — Z1239 Encounter for other screening for malignant neoplasm of breast: Secondary | ICD-10-CM

## 2017-09-01 DIAGNOSIS — Z1231 Encounter for screening mammogram for malignant neoplasm of breast: Secondary | ICD-10-CM | POA: Diagnosis not present

## 2018-01-07 DIAGNOSIS — J0110 Acute frontal sinusitis, unspecified: Secondary | ICD-10-CM | POA: Diagnosis not present

## 2018-01-07 DIAGNOSIS — G43109 Migraine with aura, not intractable, without status migrainosus: Secondary | ICD-10-CM | POA: Insufficient documentation

## 2018-01-07 DIAGNOSIS — G43509 Persistent migraine aura without cerebral infarction, not intractable, without status migrainosus: Secondary | ICD-10-CM | POA: Diagnosis not present

## 2018-02-12 LAB — LIPID PANEL
Cholesterol: 245 — AB (ref 0–200)
HDL: 99 — AB (ref 35–70)
LDL Cholesterol: 132
LDl/HDL Ratio: 1.3
Triglycerides: 75 (ref 40–160)

## 2018-03-17 ENCOUNTER — Ambulatory Visit (INDEPENDENT_AMBULATORY_CARE_PROVIDER_SITE_OTHER): Payer: BLUE CROSS/BLUE SHIELD | Admitting: Internal Medicine

## 2018-03-17 ENCOUNTER — Encounter: Payer: Self-pay | Admitting: Internal Medicine

## 2018-03-17 DIAGNOSIS — E782 Mixed hyperlipidemia: Secondary | ICD-10-CM | POA: Insufficient documentation

## 2018-03-17 DIAGNOSIS — G43109 Migraine with aura, not intractable, without status migrainosus: Secondary | ICD-10-CM

## 2018-03-17 NOTE — Progress Notes (Signed)
Date:  03/17/2018   Name:  Lindsay Howell   DOB:  Feb 20, 1961   MRN:  [id number]   Chief Complaint: Establish Care (cholesterol concerns-biometric done at work- entered in abstract all results )  Hyperlipidemia  This is a new problem. Recent lipid tests were reviewed and are variable. She has no history of chronic renal disease or hypothyroidism. There are no known factors aggravating her hyperlipidemia. Pertinent negatives include no chest pain or shortness of breath. She is currently on no antihyperlipidemic treatment.  Migraine   This is a recurrent problem. The problem occurs seasonly. The problem has been unchanged. Associated symptoms include sinus pressure and a visual change. Pertinent negatives include no dizziness or weakness. The symptoms are aggravated by bright light. She has tried nothing for the symptoms.      Review of Systems  Constitutional: Negative for chills, fatigue and unexpected weight change.  HENT: Positive for congestion and sinus pressure.   Respiratory: Negative for chest tightness and shortness of breath.   Cardiovascular: Negative for chest pain, palpitations and leg swelling.  Genitourinary: Negative for menstrual problem.  Musculoskeletal: Negative for arthralgias.  Neurological: Positive for headaches. Negative for dizziness, tremors and weakness.  Hematological: Negative for adenopathy.  Psychiatric/Behavioral: Negative for sleep disturbance.    Patient Active Problem List   Diagnosis Date Noted  . Hyperlipidemia, mixed 03/17/2018  . Intractable migraine with aura 01/07/2018  . Status post TAH-BSO 07/24/2016  . Menopause 07/18/2015  . Vaginal atrophy 07/18/2015    Prior to Admission medications   Medication Sig Start Date End Date Taking? Authorizing Provider  fluticasone (FLONASE) 50 MCG/ACT nasal spray ADMINISTER 2 SPRAYS INTO EACH NOSTRIL 1 (ONE) TIME EACH DAY. 02/25/18  Yes [provider]  Lactobacillus (ACIDOPHILUS) CAPS  capsule Take 1 capsule by mouth as needed.    Yes [provider]  Multiple Vitamin (MULTIVITAMIN) tablet Take 1 tablet by mouth daily.   Yes [provider]  Prasterone (INTRAROSA) 6.5 MG INST Place 1 applicator vaginally at bedtime.   Yes [provider]    Allergies  Allergen Reactions  . Lactose Intolerance (Gi) Diarrhea    Past Surgical History:  Procedure Laterality Date  . ABDOMINAL HYSTERECTOMY  2006  . COLONOSCOPY WITH PROPOFOL N/A 10/28/2016   Procedure: COLONOSCOPY WITH PROPOFOL;  Surgeon: Jonathon Bellows, MD;  Location: [facility] ENDOSCOPY;  Service: Endoscopy;  Laterality: N/A;  . LITHOTRIPSY      Social History   Tobacco Use  . Smoking status: Never Smoker  . Smokeless tobacco: Never Used  Substance Use Topics  . Alcohol use: Yes    Alcohol/week: 0.0 oz    Comment: socially  . Drug use: No     Medication list has been reviewed and updated.  Current Meds  Medication Sig  . fluticasone (FLONASE) 50 MCG/ACT nasal spray ADMINISTER 2 SPRAYS INTO EACH NOSTRIL 1 (ONE) TIME EACH DAY.  . Lactobacillus (ACIDOPHILUS) CAPS capsule Take 1 capsule by mouth as needed.   . Multiple Vitamin (MULTIVITAMIN) tablet Take 1 tablet by mouth daily.  . Prasterone (INTRAROSA) 6.5 MG INST Place 1 applicator vaginally at bedtime.    PHQ 2/9 Scores 03/17/2018  PHQ - 2 Score 0    Physical Exam  Constitutional: She is oriented to person, place, and time. She appears well-developed. No distress.  HENT:  Head: Normocephalic and atraumatic.  Neck: Full passive range of motion without pain. Carotid bruit is not present.  Cardiovascular: Normal rate, regular rhythm, normal heart sounds  and normal pulses.  Pulmonary/Chest: Effort normal and breath sounds normal. No respiratory distress.  Musculoskeletal: Normal range of motion.  Neurological: She is alert and oriented to person, place, and time. She has normal strength and normal reflexes.  Skin: Skin is warm and dry. No  rash noted.  Psychiatric: She has a normal mood and affect. Her speech is normal and behavior is normal. Thought content normal.  Nursing note and vitals reviewed.   BP 103/68   Pulse 68   Resp 16   Ht 5\' 3"  (1.6 m)   Wt 129 lb (58.5 kg)   SpO2 100%   BMI 22.85 kg/m   Assessment and Plan: 1. Hyperlipidemia, mixed 10 yr risk is low and HDL is very high - no medication is needed at this time for primary prevention.  2. Migraine with aura and without status migrainosus, not intractable Recommend advil or aleve at onset of headache   No orders of the defined types were placed in this encounter.   Partially dictated using Editor, commissioning. Any errors are unintentional.  Halina Maidens, MD [facility]  03/17/2018

## 2018-04-07 DIAGNOSIS — L821 Other seborrheic keratosis: Secondary | ICD-10-CM | POA: Diagnosis not present

## 2018-07-14 ENCOUNTER — Encounter: Payer: Self-pay | Admitting: Internal Medicine

## 2018-07-14 ENCOUNTER — Ambulatory Visit (INDEPENDENT_AMBULATORY_CARE_PROVIDER_SITE_OTHER): Payer: BLUE CROSS/BLUE SHIELD | Admitting: Internal Medicine

## 2018-07-14 VITALS — BP 96/54 | HR 71 | Ht 63.0 in | Wt 128.0 lb

## 2018-07-14 DIAGNOSIS — J0100 Acute maxillary sinusitis, unspecified: Secondary | ICD-10-CM | POA: Diagnosis not present

## 2018-07-14 DIAGNOSIS — R42 Dizziness and giddiness: Secondary | ICD-10-CM | POA: Diagnosis not present

## 2018-07-14 MED ORDER — MECLIZINE HCL 25 MG PO TABS: 25.0000 mg | ORAL_TABLET | Freq: Three times a day (TID) | ORAL | 0 refills | Status: DC | PRN

## 2018-07-14 MED ORDER — AZITHROMYCIN 250 MG PO TABS: ORAL_TABLET | ORAL | 0 refills | Status: AC

## 2018-07-14 NOTE — Patient Instructions (Signed)
Continue Flonase  Add sudafed 30 mg once in the morning  Antivert at bedtime

## 2018-07-14 NOTE — Progress Notes (Signed)
Date:  07/14/2018   Name:  Lindsay Howell   DOB:  Dec 20, 1960   MRN:  [id number]   Chief Complaint: Dizziness (Facial pressure. Laying on left side feels like "vertigo". Pressure and dizziness at times. X 1 month. Nasal spray helps some but still not helping at night when laying down. )  Dizziness  This is a new problem. The current episode started 1 to 4 weeks ago. The problem occurs daily. Associated symptoms include congestion. Pertinent negatives include no chest pain, chills, coughing, fatigue, fever or headaches.    Review of Systems  Constitutional: Negative for chills, fatigue and fever.  HENT: Positive for congestion and sinus pressure. Negative for facial swelling and postnasal drip.   Respiratory: Negative for cough, chest tightness and shortness of breath.   Cardiovascular: Negative for chest pain and palpitations.  Neurological: Positive for dizziness. Negative for light-headedness and headaches.  Psychiatric/Behavioral: Negative for sleep disturbance.    Patient Active Problem List   Diagnosis Date Noted  . Hyperlipidemia, mixed 03/17/2018  . Migraine with aura and without status migrainosus, not intractable 01/07/2018  . Status post TAH-BSO 07/24/2016  . Menopause 07/18/2015  . Vaginal atrophy 07/18/2015    Allergies  Allergen Reactions  . Lactose Intolerance (Gi) Diarrhea    Past Surgical History:  Procedure Laterality Date  . ABDOMINAL HYSTERECTOMY  2006   with BSO  . COLONOSCOPY WITH PROPOFOL N/A 10/28/2016   Procedure: COLONOSCOPY WITH PROPOFOL;  Surgeon: Jonathon Bellows, MD;  Location: [facility] ENDOSCOPY;  Service: Endoscopy;  Laterality: N/A;  . LITHOTRIPSY      Social History   Tobacco Use  . Smoking status: Never Smoker  . Smokeless tobacco: Never Used  Substance Use Topics  . Alcohol use: Yes    Alcohol/week: 0.0 standard drinks    Comment: socially  . Drug use: No     Medication list has been reviewed and updated.  Current Meds  Medication  Sig  . fluticasone (FLONASE) 50 MCG/ACT nasal spray ADMINISTER 2 SPRAYS INTO EACH NOSTRIL 1 (ONE) TIME EACH DAY.  . Lactobacillus (ACIDOPHILUS) CAPS capsule Take 1 capsule by mouth as needed.   . Multiple Vitamin (MULTIVITAMIN) tablet Take 1 tablet by mouth daily.  . Prasterone (INTRAROSA) 6.5 MG INST Place 1 applicator vaginally at bedtime.    PHQ 2/9 Scores 03/17/2018  PHQ - 2 Score 0    Physical Exam  Constitutional: She is oriented to person, place, and time. She appears well-developed and well-nourished.  HENT:  Right Ear: External ear and ear canal normal. Tympanic membrane is not erythematous and not retracted.  Left Ear: External ear and ear canal normal. Tympanic membrane is not erythematous and not retracted.  Nose: Right sinus exhibits maxillary sinus tenderness and frontal sinus tenderness. Left sinus exhibits maxillary sinus tenderness and frontal sinus tenderness.  Mouth/Throat: Uvula is midline and mucous membranes are normal. No oral lesions. Posterior oropharyngeal erythema present. No oropharyngeal exudate.  Eyes: Conjunctivae are normal. Right eye exhibits nystagmus. Left eye exhibits nystagmus.  Cardiovascular: Normal rate, regular rhythm and normal heart sounds.  Pulmonary/Chest: Breath sounds normal. She has no wheezes. She has no rales.  Lymphadenopathy:    She has no cervical adenopathy.  Neurological: She is alert and oriented to person, place, and time.    BP (!) 96/54 (BP Location: Right Arm, Patient Position: Sitting, Cuff Size: Normal)   Pulse 71   Ht 5\' 3"  (1.6 m)   Wt 128 lb (58.1 kg)   SpO2 100%  BMI 22.67 kg/m   Assessment and Plan: 1. Acute non-recurrent maxillary sinusitis Continue flonase, add sudafed - azithromycin (ZITHROMAX Z-PAK) 250 MG tablet; UAD  Dispense: 6 each; Refill: 0  2. Vertigo - meclizine (ANTIVERT) 25 MG tablet; Take 1 tablet (25 mg total) by mouth 3 (three) times daily as needed for dizziness.  Dispense: 30 tablet; Refill:  0   Partially dictated using Editor, commissioning. Any errors are unintentional.  Halina Maidens, MD [facility]  07/14/2018

## 2018-07-30 NOTE — Progress Notes (Deleted)
ANNUAL PREVENTATIVE CARE GYN  ENCOUNTER NOTE  Subjective:       Lindsay Howell is a 57 y.o. G31P0020 female here for a routine annual gynecologic exam.  Current complaints: 1.   No major interval health issues. Bowel function and bladder function are normal Patient is still having some discomfort with intercourse.   Gynecologic History No LMP recorded. Patient has had a hysterectomy. Contraception: status post hysterectomy- tah/bso Last Pap: no further paps needed. Results were: Last mammogram: 08/2017 birad 1. Results were: normal  Obstetric History OB History  Gravida Para Term Preterm AB Living  2       2    SAB TAB Ectopic Multiple Live Births  2            # Outcome Date GA Lbr Len/2nd Weight Sex Delivery Anes PTL Lv  2 SAB         FD  1 SAB         FD    Past Medical History:  Diagnosis Date  . Anemia   . Dyspareunia   . Fibroid   . History of kidney stones   . Menopause   . Nephrolithiasis   . Postmenopausal atrophic vaginitis   . Vaginal atrophy     Past Surgical History:  Procedure Laterality Date  . ABDOMINAL HYSTERECTOMY  2006   with BSO  . COLONOSCOPY WITH PROPOFOL N/A 10/28/2016   Procedure: COLONOSCOPY WITH PROPOFOL;  Surgeon: Jonathon Bellows, MD;  Location: [facility] ENDOSCOPY;  Service: Endoscopy;  Laterality: N/A;  . LITHOTRIPSY      Current Outpatient Medications on File Prior to Visit  Medication Sig Dispense Refill  . fluticasone (FLONASE) 50 MCG/ACT nasal spray ADMINISTER 2 SPRAYS INTO EACH NOSTRIL 1 (ONE) TIME EACH DAY.  3  . Lactobacillus (ACIDOPHILUS) CAPS capsule Take 1 capsule by mouth as needed.     . meclizine (ANTIVERT) 25 MG tablet Take 1 tablet (25 mg total) by mouth 3 (three) times daily as needed for dizziness. 30 tablet 0  . Multiple Vitamin (MULTIVITAMIN) tablet Take 1 tablet by mouth daily.    . Prasterone (INTRAROSA) 6.5 MG INST Place 1 applicator vaginally at bedtime.     No current facility-administered medications on file prior to  visit.     Allergies  Allergen Reactions  . Lactose Intolerance (Gi) Diarrhea    Social History   Socioeconomic History  . Marital status: Married    Spouse name: Not on file  . Number of children: Not on file  . Years of education: Not on file  . Highest education level: Not on file  Occupational History    Employer: [employer]  . Financial resource strain: Not on file  . Food insecurity:    Worry: Not on file    Inability: Not on file  . Transportation needs:    Medical: Not on file    Non-medical: Not on file  Tobacco Use  . Smoking status: Never Smoker  . Smokeless tobacco: Never Used  Substance and Sexual Activity  . Alcohol use: Yes    Alcohol/week: 0.0 standard drinks    Comment: socially  . Drug use: No  . Sexual activity: Yes    Birth control/protection: Post-menopausal  Lifestyle  . Physical activity:    Days per week: Not on file    Minutes per session: Not on file  . Stress: Not on file  Relationships  . Social connections:    Talks on phone: Not on  file    Gets together: Not on file    Attends religious service: Not on file    Active member of club or organization: Not on file    Attends meetings of clubs or organizations: Not on file    Relationship status: Not on file  . Intimate partner violence:    Fear of current or ex partner: Not on file    Emotionally abused: Not on file    Physically abused: Not on file    Forced sexual activity: Not on file  Other Topics Concern  . Not on file  Social History Narrative  . Not on file    Family History  Problem Relation Age of Onset  . Aneurysm Mother   . Lung cancer Father 34  . Cancer Neg Hx   . Diabetes Neg Hx   . Heart disease Neg Hx   . Breast cancer Neg Hx     The following portions of the patient's history were reviewed and updated as appropriate: allergies, current medications, past family history, past medical history, past social history, past surgical history and problem  list.  Review of Systems  anges   Objective:  There were no vitals taken for this visit. CONSTITUTIONAL: Well-developed, well-nourished female in no acute distress.  PSYCHIATRIC: Normal mood and affect. Normal behavior. Normal judgment and thought content. [place]: Alert and oriented to person, place, and time. Normal muscle tone coordination. No cranial nerve deficit noted. HENT:  Normocephalic, atraumatic, External right and left ear normal. Oropharynx is clear and moist EYES: Conjunctivae and EOM are normal.  No scleral icterus.  NECK: Normal range of motion, supple, no masses.  Normal thyroid.  SKIN: Skin is warm and dry. No rash noted. Not diaphoretic. No erythema. No pallor. CARDIOVASCULAR: Normal heart rate noted, regular rhythm, no murmur. RESPIRATORY: Clear to auscultation bilaterally. Effort and breath sounds normal, no problems with respiration noted. BREASTS: Symmetric in size. No masses, skin changes, nipple drainage, or lymphadenopathy. ABDOMEN: Soft, normal bowel sounds, no distention noted.  No tenderness, rebound or guarding.  BLADDER: Normal PELVIC:  External Genitalia: Normal  BUS: Normal  Vagina:Mild to Moderate atrophy; introitus snug, admitting single digit  Cervix: Surgically absent  Uterus: Surgically absent  Adnexa: Normal; nonpalpable nontender  RV: External Exam NormaI, No Rectal Masses and Normal Sphincter tone  MUSCULOSKELETAL: Normal range of motion. No tenderness.  No cyanosis, clubbing, or edema.  2+ distal pulses. LYMPHATIC: No Axillary, Supraclavicular, or Inguinal Adenopathy.    Assessment:   Annual gynecologic examination 57 y.o. Contraception: status post hysterectomy Normal BMI Problem List Items Addressed This Visit    Menopause   Vaginal atrophy   Status post TAH-BSO    Other Visit Diagnoses    Well woman exam with routine gynecological exam    -  Primary   Screening for breast cancer       Screen for colon cancer        Dyspareunia, female         Vulvovaginal atrophy is symptomatic;Desires alternative topical estrogen therapy Plan:  Pap: Not needed Mammogram: Ordered Stool Guaiac Testing: colonoscopy 10/2016 Labs: thru pcp Routine preventative health maintenance measures emphasized: Exercise/Diet/Weight control, Tobacco Warnings and Alcohol/Substance use risks Intrarosa vaginal suppositories nightly Return to [place], [place]   Note: This dictation was prepared with Dragon dictation along with smaller phrase technology. Any transcriptional errors that result from this process are unintentional.

## 2018-07-31 ENCOUNTER — Other Ambulatory Visit: Payer: Self-pay | Admitting: Obstetrics and Gynecology

## 2018-08-04 ENCOUNTER — Encounter: Payer: BLUE CROSS/BLUE SHIELD | Admitting: Obstetrics and Gynecology

## 2018-08-20 ENCOUNTER — Other Ambulatory Visit: Payer: Self-pay | Admitting: Obstetrics and Gynecology

## 2018-08-20 ENCOUNTER — Encounter: Payer: Self-pay | Admitting: Obstetrics and Gynecology

## 2018-08-20 ENCOUNTER — Ambulatory Visit (INDEPENDENT_AMBULATORY_CARE_PROVIDER_SITE_OTHER): Payer: BLUE CROSS/BLUE SHIELD | Admitting: Obstetrics and Gynecology

## 2018-08-20 VITALS — BP 111/70 | HR 69 | Ht 63.0 in | Wt 128.0 lb

## 2018-08-20 DIAGNOSIS — Z1211 Encounter for screening for malignant neoplasm of colon: Secondary | ICD-10-CM | POA: Diagnosis not present

## 2018-08-20 DIAGNOSIS — Z1239 Encounter for other screening for malignant neoplasm of breast: Secondary | ICD-10-CM | POA: Diagnosis not present

## 2018-08-20 DIAGNOSIS — Z01419 Encounter for gynecological examination (general) (routine) without abnormal findings: Secondary | ICD-10-CM

## 2018-08-20 DIAGNOSIS — Z780 Asymptomatic menopausal state: Secondary | ICD-10-CM

## 2018-08-20 DIAGNOSIS — N952 Postmenopausal atrophic vaginitis: Secondary | ICD-10-CM

## 2018-08-20 DIAGNOSIS — N941 Unspecified dyspareunia: Secondary | ICD-10-CM

## 2018-08-20 MED ORDER — PRASTERONE 6.5 MG VA INST: 1.0000 | VAGINAL_INSERT | Freq: Every day | VAGINAL | 11 refills | Status: DC

## 2018-08-20 NOTE — Progress Notes (Signed)
ANNUAL PREVENTATIVE CARE GYN  ENCOUNTER NOTE  Subjective:       Lindsay Howell is a 57 y.o. G44P0020 female here for a routine annual gynecologic exam.  Current complaints: 1. none  No major interval health issues. Bowel function and bladder function are normal Patient is still having some discomfort with intercourse.  She is using the Intrarosa suppositories was noted some vaginal dryness.  The biggest issue is penetration discomfort at the introitus; she does have notable narrowing of the introitus.  My clinical exams only allow a single digit exam comfortably.   Gynecologic History No LMP recorded. Patient has had a hysterectomy. Contraception: status post hysterectomy- tah/bso Last Pap: no further paps needed. Results were: Last mammogram: 08/2017 birad 1. Results were: normal  Obstetric History OB History  Gravida Para Term Preterm AB Living  2       2    SAB TAB Ectopic Multiple Live Births  2            # Outcome Date GA Lbr Len/2nd Weight Sex Delivery Anes PTL Lv  2 SAB         FD  1 SAB         FD    Past Medical History:  Diagnosis Date  . Anemia   . Dyspareunia   . Fibroid   . History of kidney stones   . Menopause   . Nephrolithiasis   . Postmenopausal atrophic vaginitis   . Vaginal atrophy     Past Surgical History:  Procedure Laterality Date  . ABDOMINAL HYSTERECTOMY  2006   with BSO  . COLONOSCOPY WITH PROPOFOL N/A 10/28/2016   Procedure: COLONOSCOPY WITH PROPOFOL;  Surgeon: Jonathon Bellows, MD;  Location: [facility] ENDOSCOPY;  Service: Endoscopy;  Laterality: N/A;  . LITHOTRIPSY      Current Outpatient Medications on File Prior to Visit  Medication Sig Dispense Refill  . fluticasone (FLONASE) 50 MCG/ACT nasal spray ADMINISTER 2 SPRAYS INTO EACH NOSTRIL 1 (ONE) TIME EACH DAY.  3  . Lactobacillus (ACIDOPHILUS) CAPS capsule Take 1 capsule by mouth as needed.     . meclizine (ANTIVERT) 25 MG tablet Take 1 tablet (25 mg total) by mouth 3 (three) times daily as  needed for dizziness. 30 tablet 0  . Multiple Vitamin (MULTIVITAMIN) tablet Take 1 tablet by mouth daily.    . Prasterone (INTRAROSA) 6.5 MG INST Place 1 applicator vaginally at bedtime.     No current facility-administered medications on file prior to visit.     Allergies  Allergen Reactions  . Lactose Intolerance (Gi) Diarrhea    Social History   Socioeconomic History  . Marital status: Married    Spouse name: Not on file  . Number of children: Not on file  . Years of education: Not on file  . Highest education level: Not on file  Occupational History    Employer: [employer]  . Financial resource strain: Not on file  . Food insecurity:    Worry: Not on file    Inability: Not on file  . Transportation needs:    Medical: Not on file    Non-medical: Not on file  Tobacco Use  . Smoking status: Never Smoker  . Smokeless tobacco: Never Used  Substance and Sexual Activity  . Alcohol use: Yes    Alcohol/week: 0.0 standard drinks    Comment: socially  . Drug use: No  . Sexual activity: Yes    Birth control/protection: Post-menopausal  Lifestyle  .  Physical activity:    Days per week: Not on file    Minutes per session: Not on file  . Stress: Not on file  Relationships  . Social connections:    Talks on phone: Not on file    Gets together: Not on file    Attends religious service: Not on file    Active member of club or organization: Not on file    Attends meetings of clubs or organizations: Not on file    Relationship status: Not on file  . Intimate partner violence:    Fear of current or ex partner: Not on file    Emotionally abused: Not on file    Physically abused: Not on file    Forced sexual activity: Not on file  Other Topics Concern  . Not on file  Social History Narrative  . Not on file    Family History  Problem Relation Age of Onset  . Aneurysm Mother   . Lung cancer Father 78  . Cancer Neg Hx   . Diabetes Neg Hx   . Heart disease Neg  Hx   . Breast cancer Neg Hx     The following portions of the patient's history were reviewed and updated as appropriate: allergies, current medications, past family history, past medical history, past social history, past surgical history and problem list.  Review of Systems Review of Systems  Constitutional:       Minimal vasomotor symptoms  Respiratory: Negative.   Cardiovascular: Negative.   Gastrointestinal: Negative.   Genitourinary: Negative.        Penetrance dyspareunia is limiting frequency of intercourse  Musculoskeletal: Negative.   Psychiatric/Behavioral: The patient is nervous/anxious.   All other systems reviewed and are negative.    Objective:  BP 111/70   Pulse 69   Ht 5\' 3"  (1.6 m)   Wt 128 lb (58.1 kg)   BMI 22.67 kg/m  CONSTITUTIONAL: Well-developed, well-nourished female in no acute distress.  PSYCHIATRIC: Normal mood and affect. Normal behavior. Normal judgment and thought content. [place]: Alert and oriented to person, place, and time. Normal muscle tone coordination. No cranial nerve deficit noted. HENT:  Normocephalic, atraumatic, External right and left ear normal.  EYES: Conjunctivae and EOM are normal.  No scleral icterus.  NECK: Normal range of motion, supple, no masses.  Normal thyroid.  SKIN: Skin is warm and dry. No rash noted. Not diaphoretic. No erythema. No pallor. CARDIOVASCULAR: Normal heart rate noted, regular rhythm, no murmur. RESPIRATORY: Clear to auscultation bilaterally. Effort and breath sounds normal, no problems with respiration noted. BREASTS: Symmetric in size. No masses, skin changes, nipple drainage, or lymphadenopathy. ABDOMEN: Soft, no distention noted.  No tenderness, rebound or guarding.  BLADDER: Normal PELVIC:  External Genitalia: Normal  BUS: Normal  Vagina:Mild to Moderate atrophy; introitus snug, admitting single digit; no significant palpable tenderness or mass within the vagina  Cervix: Surgically  absent  Uterus: Surgically absent  Adnexa: Normal; nonpalpable nontender  RV: External Exam NormaI, No Rectal Masses and Normal Sphincter tone  MUSCULOSKELETAL: Normal range of motion. No tenderness.  No cyanosis, clubbing, or edema.  2+ distal pulses. LYMPHATIC: No Axillary, Supraclavicular, or Inguinal Adenopathy.    Assessment:   Annual gynecologic examination 58 y.o. Contraception: status post hysterectomy Normal BMI Vulvovaginal atrophy is minimally symptomatic while using Intrarosa suppositories Penetrance dyspareunia related to snug introitus  Plan:  Pap: Not needed Mammogram: Ordered Stool Guaiac Testing: stool cards ordered Labs: thru pcp Routine preventative health maintenance measures  emphasized: Exercise/Diet/Weight control, Tobacco Warnings and Alcohol/Substance use risks Continue Intrarosa vaginal suppositories nightly Strongly recommend and encouraged vaginal dilator therapy; patient will get the vaginal dilators from www.[email or web address].com and will work on the exercises 3 times a week.  She may contact me for any problems with troubleshooting the exercises. 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  I have reviewed, updated, and concur with the information that has been scribed by Joyice Faster, CMA Brayton Mars, MD   Note: This dictation was prepared with Dragon dictation along with smaller phrase technology. Any transcriptional errors that result from this process are unintentional.

## 2018-08-20 NOTE — Patient Instructions (Addendum)
1.  No Pap smear is done.  No further Pap smears are needed. 2.  Mammogram is ordered. 3.  Screening labs are obtained through primary care. 4.  Stool guaiac cards are given for colon cancer screening 5.  Continue using Intrarosa suppositories nightly for vaginal dryness 6.  Recommend calcium 600 mg twice a day and vitamin D 400 international units twice a day 7.  Recommend vaginal dilator therapy.  Vaginal dilators may be obtained:  [email or web address] 8.  Schedule annual exam in 1 year-patient to begin seeing Dr. Army Melia   Health Maintenance for Postmenopausal Women Menopause is a normal process in which your reproductive ability comes to an end. This process happens gradually over a span of months to years, usually between the ages of 43 and 71. Menopause is complete when you have missed 12 consecutive menstrual periods. It is important to talk with your health care provider about some of the most common conditions that affect postmenopausal women, such as heart disease, cancer, and bone loss (osteoporosis). Adopting a healthy lifestyle and getting preventive care can help to promote your health and wellness. Those actions can also lower your chances of developing some of these common conditions. What should I know about menopause? During menopause, you may experience a number of symptoms, such as:  Moderate-to-severe hot flashes.  Night sweats.  Decrease in sex drive.  Mood swings.  Headaches.  Tiredness.  Irritability.  Memory problems.  Insomnia.  Choosing to treat or not to treat menopausal changes is an individual decision that you make with your health care provider. What should I know about hormone replacement therapy and supplements? Hormone therapy products are effective for treating symptoms that are associated with menopause, such as hot flashes and night sweats. Hormone replacement carries certain risks, especially as you become older. If you are thinking about  using estrogen or estrogen with progestin treatments, discuss the benefits and risks with your health care provider. What should I know about heart disease and stroke? Heart disease, heart attack, and stroke become more likely as you age. This may be due, in part, to the hormonal changes that your body experiences during menopause. These can affect how your body processes dietary fats, triglycerides, and cholesterol. Heart attack and stroke are both medical emergencies. There are many things that you can do to help prevent heart disease and stroke:  Have your blood pressure checked at least every 1-2 years. High blood pressure causes heart disease and increases the risk of stroke.  If you are 61-85 years old, ask your health care provider if you should take aspirin to prevent a heart attack or a stroke.  Do not use any tobacco products, including cigarettes, chewing tobacco, or electronic cigarettes. If you need help quitting, ask your health care provider.  It is important to eat a healthy diet and maintain a healthy weight. ? Be sure to include plenty of vegetables, fruits, low-fat dairy products, and lean protein. ? Avoid eating foods that are high in solid fats, added sugars, or salt (sodium).  Get regular exercise. This is one of the most important things that you can do for your health. ? Try to exercise for at least 150 minutes each week. The type of exercise that you do should increase your heart rate and make you sweat. This is known as moderate-intensity exercise. ? Try to do strengthening exercises at least twice each week. Do these in addition to the moderate-intensity exercise.  Know your numbers.Ask your health care provider  to check your cholesterol and your blood glucose. Continue to have your blood tested as directed by your health care provider.  What should I know about cancer screening? There are several types of cancer. Take the following steps to reduce your risk and to  catch any cancer development as early as possible. Breast Cancer  Practice breast self-awareness. ? This means understanding how your breasts normally appear and feel. ? It also means doing regular breast self-exams. Let your health care provider know about any changes, no matter how small.  If you are 31 or older, have a clinician do a breast exam (clinical breast exam or CBE) every year. Depending on your age, family history, and medical history, it may be recommended that you also have a yearly breast X-ray (mammogram).  If you have a family history of breast cancer, talk with your health care provider about genetic screening.  If you are at high risk for breast cancer, talk with your health care provider about having an MRI and a mammogram every year.  Breast cancer (BRCA) gene test is recommended for women who have family members with BRCA-related cancers. Results of the assessment will determine the need for genetic counseling and BRCA1 and for BRCA2 testing. BRCA-related cancers include these types: ? Breast. This occurs in males or females. ? Ovarian. ? Tubal. This may also be called fallopian tube cancer. ? Cancer of the abdominal or pelvic lining (peritoneal cancer). ? Prostate. ? Pancreatic.  Cervical, Uterine, and Ovarian Cancer Your health care provider may recommend that you be screened regularly for cancer of the pelvic organs. These include your ovaries, uterus, and vagina. This screening involves a pelvic exam, which includes checking for microscopic changes to the surface of your cervix (Pap test).  For women ages 21-65, health care providers may recommend a pelvic exam and a Pap test every three years. For women ages 60-65, they may recommend the Pap test and pelvic exam, combined with testing for human papilloma virus (HPV), every five years. Some types of HPV increase your risk of cervical cancer. Testing for HPV may also be done on women of any age who have unclear Pap  test results.  Other health care providers may not recommend any screening for nonpregnant women who are considered low risk for pelvic cancer and have no symptoms. Ask your health care provider if a screening pelvic exam is right for you.  If you have had past treatment for cervical cancer or a condition that could lead to cancer, you need Pap tests and screening for cancer for at least 20 years after your treatment. If Pap tests have been discontinued for you, your risk factors (such as having a new sexual partner) need to be reassessed to determine if you should start having screenings again. Some women have medical problems that increase the chance of getting cervical cancer. In these cases, your health care provider may recommend that you have screening and Pap tests more often.  If you have a family history of uterine cancer or ovarian cancer, talk with your health care provider about genetic screening.  If you have vaginal bleeding after reaching menopause, tell your health care provider.  There are currently no reliable tests available to screen for ovarian cancer.  Lung Cancer Lung cancer screening is recommended for adults 17-60 years old who are at high risk for lung cancer because of a history of smoking. A yearly low-dose CT scan of the lungs is recommended if you:  Currently  smoke.  Have a history of at least 30 pack-years of smoking and you currently smoke or have quit within the past 15 years. A pack-year is smoking an average of one pack of cigarettes per day for one year.  Yearly screening should:  Continue until it has been 15 years since you quit.  Stop if you develop a health problem that would prevent you from having lung cancer treatment.  Colorectal Cancer  This type of cancer can be detected and can often be prevented.  Routine colorectal cancer screening usually begins at age 53 and continues through age 16.  If you have risk factors for colon cancer, your  health care provider may recommend that you be screened at an earlier age.  If you have a family history of colorectal cancer, talk with your health care provider about genetic screening.  Your health care provider may also recommend using home test kits to check for hidden blood in your stool.  A small camera at the end of a tube can be used to examine your colon directly (sigmoidoscopy or colonoscopy). This is done to check for the earliest forms of colorectal cancer.  Direct examination of the colon should be repeated every 5-10 years until age 37. However, if early forms of precancerous polyps or small growths are found or if you have a family history or genetic risk for colorectal cancer, you may need to be screened more often.  Skin Cancer  Check your skin from head to toe regularly.  Monitor any moles. Be sure to tell your health care provider: ? About any new moles or changes in moles, especially if there is a change in a mole's shape or color. ? If you have a mole that is larger than the size of a pencil eraser.  If any of your family members has a history of skin cancer, especially at a young age, talk with your health care provider about genetic screening.  Always use sunscreen. Apply sunscreen liberally and repeatedly throughout the day.  Whenever you are outside, protect yourself by wearing long sleeves, pants, a wide-brimmed hat, and sunglasses.  What should I know about osteoporosis? Osteoporosis is a condition in which bone destruction happens more quickly than new bone creation. After menopause, you may be at an increased risk for osteoporosis. To help prevent osteoporosis or the bone fractures that can happen because of osteoporosis, the following is recommended:  If you are 69-34 years old, get at least 1,000 mg of calcium and at least 600 mg of vitamin D per day.  If you are older than age 57 but younger than age 20, get at least 1,200 mg of calcium and at least 600  mg of vitamin D per day.  If you are older than age 55, get at least 1,200 mg of calcium and at least 800 mg of vitamin D per day.  Smoking and excessive alcohol intake increase the risk of osteoporosis. Eat foods that are rich in calcium and vitamin D, and do weight-bearing exercises several times each week as directed by your health care provider. What should I know about how menopause affects my mental health? Depression may occur at any age, but it is more common as you become older. Common symptoms of depression include:  Low or sad mood.  Changes in sleep patterns.  Changes in appetite or eating patterns.  Feeling an overall lack of motivation or enjoyment of activities that you previously enjoyed.  Frequent crying spells.  Talk  with your health care provider if you think that you are experiencing depression. What should I know about immunizations? It is important that you get and maintain your immunizations. These include:  Tetanus, diphtheria, and pertussis (Tdap) booster vaccine.  Influenza every year before the flu season begins.  Pneumonia vaccine.  Shingles vaccine.  Your health care provider may also recommend other immunizations. This information is not intended to replace advice given to you by your health care provider. Make sure you discuss any questions you have with your health care provider. Document Released: 11/22/2005 Document Revised: 04/19/2016 Document Reviewed: 07/04/2015 Elsevier Interactive Patient Education  2018 [employer]vier Inc.  

## 2018-09-23 ENCOUNTER — Ambulatory Visit
Admission: RE | Admit: 2018-09-23 | Discharge: 2018-09-23 | Disposition: A | Discharge status: Home / Self Care | Payer: BLUE CROSS/BLUE SHIELD | Source: Ambulatory Visit | Attending: Obstetrics and Gynecology | Admitting: Obstetrics and Gynecology

## 2018-09-23 DIAGNOSIS — Z1239 Encounter for other screening for malignant neoplasm of breast: Secondary | ICD-10-CM

## 2018-09-23 DIAGNOSIS — Z1231 Encounter for screening mammogram for malignant neoplasm of breast: Secondary | ICD-10-CM | POA: Diagnosis not present

## 2018-11-22 DIAGNOSIS — N390 Urinary tract infection, site not specified: Secondary | ICD-10-CM | POA: Diagnosis not present

## 2018-12-11 ENCOUNTER — Encounter: Payer: BLUE CROSS/BLUE SHIELD | Admitting: Internal Medicine

## 2019-07-31 DIAGNOSIS — N390 Urinary tract infection, site not specified: Secondary | ICD-10-CM | POA: Diagnosis not present

## 2019-08-31 ENCOUNTER — Other Ambulatory Visit: Payer: Self-pay

## 2019-08-31 ENCOUNTER — Encounter: Payer: Self-pay | Admitting: Internal Medicine

## 2019-08-31 ENCOUNTER — Ambulatory Visit (INDEPENDENT_AMBULATORY_CARE_PROVIDER_SITE_OTHER): Payer: BC Managed Care – PPO | Admitting: Internal Medicine

## 2019-08-31 VITALS — BP 108/68 | HR 66 | Ht 63.0 in | Wt 127.0 lb

## 2019-08-31 DIAGNOSIS — E782 Mixed hyperlipidemia: Secondary | ICD-10-CM

## 2019-08-31 DIAGNOSIS — G43109 Migraine with aura, not intractable, without status migrainosus: Secondary | ICD-10-CM

## 2019-08-31 DIAGNOSIS — Z1231 Encounter for screening mammogram for malignant neoplasm of breast: Secondary | ICD-10-CM

## 2019-08-31 DIAGNOSIS — N952 Postmenopausal atrophic vaginitis: Secondary | ICD-10-CM

## 2019-08-31 DIAGNOSIS — Z1211 Encounter for screening for malignant neoplasm of colon: Secondary | ICD-10-CM | POA: Diagnosis not present

## 2019-08-31 DIAGNOSIS — Z0000 Encounter for general adult medical examination without abnormal findings: Secondary | ICD-10-CM | POA: Diagnosis not present

## 2019-08-31 DIAGNOSIS — Z1159 Encounter for screening for other viral diseases: Secondary | ICD-10-CM | POA: Diagnosis not present

## 2019-08-31 LAB — POCT URINALYSIS DIPSTICK
Bilirubin, UA: NEGATIVE
Blood, UA: NEGATIVE
Glucose, UA: NEGATIVE
Ketones, UA: NEGATIVE
Leukocytes, UA: NEGATIVE
Nitrite, UA: NEGATIVE
Protein, UA: NEGATIVE
Spec Grav, UA: 1.01 (ref 1.010–1.025)
Urobilinogen, UA: 0.2 E.U./dL
pH, UA: 6 (ref 5.0–8.0)

## 2019-08-31 MED ORDER — INTRAROSA 6.5 MG VA INST: 1.0000 "application " | VAGINAL_INSERT | Freq: Every day | VAGINAL | 12 refills | Status: DC

## 2019-08-31 NOTE — Progress Notes (Signed)
Date:  08/31/2019   Name:  Lindsay Howell   DOB:  1961-01-01   MRN:  [id number]   Chief Complaint: Annual Exam (Breast Exam. Mammo order. Declined flu shot.) Lindsay Howell is a 58 y.o. female who presents today for her Complete Annual Exam. She feels well. She reports exercising regularly. She reports she is sleeping well. She sees GYN for routine pelvic and mammograms but he retired.  She denies breast problems.  Mammogram  09/2018 Colonoscopy - 10/2016 repeat 10 yrs Immunizations - none Pap - discontinued  Migraine  The problem has been resolved (has not had a migraine in some time). Pertinent negatives include no abdominal pain, coughing, dizziness, fever, hearing loss, tinnitus or vomiting.    No results found for: CREATININE, BUN, NA, K, CL, CO2 Lab Results  Component Value Date   CHOL 245 (A) 02/12/2018   HDL 99 (A) 02/12/2018   LDLCALC 132 02/12/2018   TRIG 75 02/12/2018   No results found for: TSH No results found for: HGBA1C   Review of Systems  Constitutional: Negative for chills, fatigue and fever.  HENT: Negative for congestion, hearing loss, tinnitus, trouble swallowing and voice change.   Eyes: Negative for visual disturbance.  Respiratory: Negative for cough, chest tightness, shortness of breath and wheezing.   Cardiovascular: Negative for chest pain, palpitations and leg swelling.  Gastrointestinal: Negative for abdominal pain, constipation, diarrhea and vomiting.  Endocrine: Negative for polydipsia and polyuria.  Genitourinary: Positive for dyspareunia (vaginal atrophy). Negative for difficulty urinating, dysuria, frequency, genital sores, vaginal bleeding and vaginal discharge.  Musculoskeletal: Positive for arthralgias (right knee and right hip). Negative for gait problem and joint swelling.  Skin: Negative for color change and rash.  Neurological: Negative for dizziness, tremors, light-headedness and headaches.  Hematological: Negative for  adenopathy. Does not bruise/bleed easily.  Psychiatric/Behavioral: Negative for dysphoric mood and sleep disturbance. The patient is not nervous/anxious.     Patient Active Problem List   Diagnosis Date Noted  . Dyspareunia, female 08/20/2018  . Hyperlipidemia, mixed 03/17/2018  . Migraine with aura and without status migrainosus, not intractable 01/07/2018  . Status post TAH-BSO 07/24/2016  . Menopause 07/18/2015  . Vaginal atrophy 07/18/2015    Allergies  Allergen Reactions  . Lactose Intolerance (Gi) Diarrhea    Past Surgical History:  Procedure Laterality Date  . ABDOMINAL HYSTERECTOMY  2006   with BSO  . COLONOSCOPY WITH PROPOFOL N/A 10/28/2016   Procedure: COLONOSCOPY WITH PROPOFOL;  Surgeon: Jonathon Bellows, MD;  Location: [facility] ENDOSCOPY;  Service: Endoscopy;  Laterality: N/A;  . LITHOTRIPSY      Social History   Tobacco Use  . Smoking status: Never Smoker  . Smokeless tobacco: Never Used  Substance Use Topics  . Alcohol use: Yes    Alcohol/week: 0.0 standard drinks    Comment: socially  . Drug use: No     Medication list has been reviewed and updated.  Current Meds  Medication Sig  . fluticasone (FLONASE) 50 MCG/ACT nasal spray ADMINISTER 2 SPRAYS INTO EACH NOSTRIL 1 (ONE) TIME EACH DAY.  . INTRAROSA 6.5 MG INST PLACE 1 APPLICATOR VAGINALLY AT BEDTIME.  . Lactobacillus (ACIDOPHILUS) CAPS capsule Take 1 capsule by mouth as needed.   . Multiple Vitamin (MULTIVITAMIN) tablet Take 1 tablet by mouth daily.    PHQ 2/9 Scores 03/17/2018  PHQ - 2 Score 0    BP Readings from Last 3 Encounters:  08/31/19 108/68  08/20/18 111/70  07/14/18 (!) 96/54  Physical Exam Vitals signs and nursing note reviewed.  Constitutional:      General: She is not in acute distress.    Appearance: She is well-developed.  HENT:     Head: Normocephalic and atraumatic.     Right Ear: Tympanic membrane and ear canal normal.     Left Ear: Tympanic membrane and ear canal normal.      Nose:     Right Sinus: No maxillary sinus tenderness.     Left Sinus: No maxillary sinus tenderness.  Eyes:     General: No scleral icterus.       Right eye: No discharge.        Left eye: No discharge.     Conjunctiva/sclera: Conjunctivae normal.  Neck:     Musculoskeletal: Normal range of motion. No erythema.     Thyroid: No thyromegaly.     Vascular: No carotid bruit.  Cardiovascular:     Rate and Rhythm: Normal rate and regular rhythm.     Pulses: Normal pulses.     Heart sounds: Normal heart sounds.  Pulmonary:     Effort: Pulmonary effort is normal. No respiratory distress.     Breath sounds: No wheezing.  Chest:     Breasts:        Right: No mass, nipple discharge, skin change or tenderness.        Left: No mass, nipple discharge, skin change or tenderness.  Abdominal:     General: Bowel sounds are normal.     Palpations: Abdomen is soft.     Tenderness: There is no abdominal tenderness.  Musculoskeletal: Normal range of motion.     Right lower leg: No edema.     Left lower leg: No edema.  Lymphadenopathy:     Cervical: No cervical adenopathy.  Skin:    General: Skin is warm and dry.     Capillary Refill: Capillary refill takes less than 2 seconds.     Findings: No rash.  Neurological:     General: No focal deficit present.     Mental Status: She is alert and oriented to person, place, and time.     Cranial Nerves: No cranial nerve deficit.     Sensory: No sensory deficit.     Deep Tendon Reflexes: Reflexes are normal and symmetric.  Psychiatric:        Speech: Speech normal.        Behavior: Behavior normal.        Thought Content: Thought content normal.     Wt Readings from Last 3 Encounters:  08/31/19 127 lb (57.6 kg)  08/20/18 128 lb (58.1 kg)  07/14/18 128 lb (58.1 kg)    BP 108/68   Pulse 66   Ht 5\' 3"  (1.6 m)   Wt 127 lb (57.6 kg)   SpO2 100%   BMI 22.50 kg/m   Assessment and Plan: 1. Annual physical exam Normal exam - continue healthy  diet and exercise Pt is considering Shingrix - CBC with Differential/Platelet - Comprehensive metabolic panel - POCT urinalysis dipstick - TSH  2. Encounter for screening mammogram for breast cancer Schedule at [place]; Future  3. Colon cancer screening Due in 2028  4. Migraine with aura and without status migrainosus, not intractable Pt denies any recent headaches  5. Hyperlipidemia, mixed Check labs and advise - Lipid panel  6. Need for hepatitis C screening test - Hepatitis C antibody  7. Vaginal atrophy Continue prasterone vaginally  Recommend follow up with GYN if symptoms worsen - Prasterone (INTRAROSA) 6.5 MG INST; Place 1 application vaginally at bedtime.  Dispense: 28 each; Refill: 12   Partially dictated using Editor, commissioning. Any errors are unintentional.  Halina Maidens, MD [facility]  08/31/2019

## 2019-09-01 LAB — COMPREHENSIVE METABOLIC PANEL
ALT: 48 IU/L — ABNORMAL HIGH (ref 0–32)
AST: 30 IU/L (ref 0–40)
Albumin/Globulin Ratio: 1.9 (ref 1.2–2.2)
Albumin: 4.6 g/dL (ref 3.8–4.9)
Alkaline Phosphatase: 86 IU/L (ref 39–117)
BUN/Creatinine Ratio: 13 (ref 12–28)
BUN: 11 mg/dL (ref 8–27)
Bilirubin Total: 1 mg/dL (ref 0.0–1.2)
CO2: 25 mmol/L (ref 20–29)
Calcium: 10 mg/dL (ref 8.7–10.3)
Chloride: 101 mmol/L (ref 96–106)
Creatinine, Ser: 0.84 mg/dL (ref 0.57–1.00)
GFR calc Af Amer: 87 mL/min/{1.73_m2} (ref >59)
GFR calc non Af Amer: 76 mL/min/{1.73_m2} (ref >59)
Globulin, Total: 2.4 g/dL (ref 1.5–4.5)
Glucose: 74 mg/dL (ref 65–99)
Potassium: 4.4 mmol/L (ref 3.5–5.2)
Sodium: 141 mmol/L (ref 134–144)
Total Protein: 7 g/dL (ref 6.0–8.5)

## 2019-09-01 LAB — TSH: TSH: 1.63 u[IU]/mL (ref 0.450–4.500)

## 2019-09-01 LAB — CBC WITH DIFFERENTIAL/PLATELET
Basophils Absolute: 0 10*3/uL (ref 0.0–0.2)
Basos: 0 %
EOS (ABSOLUTE): 0 10*3/uL (ref 0.0–0.4)
Eos: 1 %
Hematocrit: 37.6 % (ref 34.0–46.6)
Hemoglobin: 12.3 g/dL (ref 11.1–15.9)
Immature Grans (Abs): 0 10*3/uL (ref 0.0–0.1)
Immature Granulocytes: 0 %
Lymphocytes Absolute: 2.8 10*3/uL (ref 0.7–3.1)
Lymphs: 38 %
MCH: 28 pg (ref 26.6–33.0)
MCHC: 32.7 g/dL (ref 31.5–35.7)
MCV: 86 fL (ref 79–97)
Monocytes Absolute: 0.6 10*3/uL (ref 0.1–0.9)
Monocytes: 8 %
Neutrophils Absolute: 3.9 10*3/uL (ref 1.4–7.0)
Neutrophils: 53 %
Platelets: 223 10*3/uL (ref 150–450)
RBC: 4.39 x10E6/uL (ref 3.77–5.28)
RDW: 13.1 % (ref 11.7–15.4)
WBC: 7.4 10*3/uL (ref 3.4–10.8)

## 2019-09-01 LAB — LIPID PANEL
Chol/HDL Ratio: 2.4 ratio (ref 0.0–4.4)
Cholesterol, Total: 280 mg/dL — ABNORMAL HIGH (ref 100–199)
HDL: 117 mg/dL (ref >39)
LDL Chol Calc (NIH): 155 mg/dL — ABNORMAL HIGH (ref 0–99)
Triglycerides: 54 mg/dL (ref 0–149)
VLDL Cholesterol Cal: 8 mg/dL (ref 5–40)

## 2019-09-01 LAB — HEPATITIS C ANTIBODY: Hep C Virus Ab: <0.1 s/co ratio (ref 0.0–0.9)

## 2019-09-28 ENCOUNTER — Ambulatory Visit
Admission: RE | Admit: 2019-09-28 | Discharge: 2019-09-28 | Disposition: A | Discharge status: Home / Self Care | Payer: BC Managed Care – PPO | Source: Ambulatory Visit | Attending: Internal Medicine | Admitting: Internal Medicine

## 2019-09-28 DIAGNOSIS — Z1231 Encounter for screening mammogram for malignant neoplasm of breast: Secondary | ICD-10-CM | POA: Diagnosis not present

## 2020-06-29 DIAGNOSIS — N390 Urinary tract infection, site not specified: Secondary | ICD-10-CM | POA: Diagnosis not present

## 2020-08-22 ENCOUNTER — Telehealth: Payer: Self-pay

## 2020-08-22 ENCOUNTER — Other Ambulatory Visit: Payer: Self-pay

## 2020-08-22 DIAGNOSIS — Z1231 Encounter for screening mammogram for malignant neoplasm of breast: Secondary | ICD-10-CM

## 2020-08-22 NOTE — Telephone Encounter (Signed)
Called pt she is not having any breast problems at this time. Ordered mammo. For patient. Pt verbalized understanding.  KP

## 2020-08-22 NOTE — Telephone Encounter (Signed)
Copied from [place] [id number]. Topic: General - Other >> Aug 22, 2020 10:14 AM Oneta Rack wrote: Reason for CRM:  ' patient requesting annual mamo orders please place at [facility] at [facility]. Patient attempted to schedule a CPE (she's due 08/30/2020) but PCP has no physical availability until 10/21/2019 requesting a work in to obtain [facility] order , please respond in patient My Chart

## 2020-09-04 ENCOUNTER — Other Ambulatory Visit: Payer: Self-pay

## 2020-09-04 ENCOUNTER — Ambulatory Visit (INDEPENDENT_AMBULATORY_CARE_PROVIDER_SITE_OTHER): Payer: BC Managed Care – PPO | Admitting: Internal Medicine

## 2020-09-04 ENCOUNTER — Encounter: Payer: Self-pay | Admitting: Internal Medicine

## 2020-09-04 VITALS — BP 124/78 | HR 83 | Temp 98.1°F | Ht 63.0 in | Wt 127.0 lb

## 2020-09-04 DIAGNOSIS — Z1231 Encounter for screening mammogram for malignant neoplasm of breast: Secondary | ICD-10-CM | POA: Diagnosis not present

## 2020-09-04 DIAGNOSIS — E782 Mixed hyperlipidemia: Secondary | ICD-10-CM

## 2020-09-04 DIAGNOSIS — Z0000 Encounter for general adult medical examination without abnormal findings: Secondary | ICD-10-CM

## 2020-09-04 DIAGNOSIS — N952 Postmenopausal atrophic vaginitis: Secondary | ICD-10-CM | POA: Diagnosis not present

## 2020-09-04 LAB — POCT URINALYSIS DIPSTICK
Bilirubin, UA: NEGATIVE
Blood, UA: NEGATIVE
Glucose, UA: NEGATIVE
Ketones, UA: NEGATIVE
Leukocytes, UA: NEGATIVE
Nitrite, UA: NEGATIVE
Protein, UA: NEGATIVE
Spec Grav, UA: 1.01 (ref 1.010–1.025)
Urobilinogen, UA: 0.2 E.U./dL
pH, UA: 7.5 (ref 5.0–8.0)

## 2020-09-04 MED ORDER — INTRAROSA 6.5 MG VA INST: 1.0000 "application " | VAGINAL_INSERT | Freq: Every day | VAGINAL | 12 refills | Status: DC

## 2020-09-04 NOTE — Progress Notes (Signed)
Date:  09/04/2020    Name:  Lindsay Howell   DOB:  11/15/1960   MRN:  [id number]   Chief Complaint: Annual Exam (Breast Exam. Pap smear- discontinued. ) [name] is a 59 y.o. female who presents today for her Complete Annual Exam. She feels well. She reports exercising - walking 5 days weekly. She reports she is sleeping fairly well. Breast complaints - none.  Mammogram: 09/2019 scheduled 09/28/20 DEXA: none Pap smear: 08/2008 discontinued due to TAH Colonoscopy: 10/2016 repeat 2028  Immunization History  Administered Date(s) Administered  . PFIZER SARS-COV-2 Vaccination 12/29/2019, 01/19/2020  Declines flu vaccine. Discussed Shingles vaccine.   Migraine  This is a recurrent problem. The problem has been resolved (none in several years). Pertinent negatives include no abdominal pain, coughing, dizziness, fever, hearing loss, tinnitus or vomiting.    Lab Results  Component Value Date   CREATININE 0.84 08/31/2019   BUN 11 08/31/2019   NA 141 08/31/2019   K 4.4 08/31/2019   CL 101 08/31/2019   CO2 25 08/31/2019   Lab Results  Component Value Date   CHOL 280 (H) 08/31/2019   HDL 117 08/31/2019   LDLCALC 155 (H) 08/31/2019   TRIG 54 08/31/2019   CHOLHDL 2.4 08/31/2019   Lab Results  Component Value Date   TSH 1.630 08/31/2019   No results found for: HGBA1C Lab Results  Component Value Date   WBC 7.4 08/31/2019   HGB 12.3 08/31/2019   HCT 37.6 08/31/2019   MCV 86 08/31/2019   PLT 223 08/31/2019   Lab Results  Component Value Date   ALT 48 (H) 08/31/2019   AST 30 08/31/2019   ALKPHOS 86 08/31/2019   BILITOT 1.0 08/31/2019     Review of Systems  Constitutional: Negative for chills, fatigue and fever.  HENT: Negative for congestion, hearing loss, tinnitus, trouble swallowing and voice change.   Eyes: Negative for visual disturbance.  Respiratory: Negative for cough, chest tightness, shortness of breath and wheezing.   Cardiovascular: Negative for  chest pain, palpitations and leg swelling.  Gastrointestinal: Negative for abdominal pain, constipation, diarrhea and vomiting.  Endocrine: Negative for polydipsia and polyuria.  Genitourinary: Negative for dysuria, frequency, genital sores, vaginal bleeding and vaginal discharge.  Musculoskeletal: Negative for arthralgias, gait problem and joint swelling.  Skin: Negative for color change and rash.  Neurological: Positive for headaches. Negative for dizziness, tremors and light-headedness.  Hematological: Negative for adenopathy. Does not bruise/bleed easily.  Psychiatric/Behavioral: Negative for dysphoric mood and sleep disturbance. The patient is not nervous/anxious.     Patient Active Problem List   Diagnosis Date Noted  . Dyspareunia, female 08/20/2018  . Hyperlipidemia, mixed 03/17/2018  . Migraine with aura and without status migrainosus, not intractable 01/07/2018  . Status post TAH-BSO 07/24/2016  . Menopause 07/18/2015  . Vaginal atrophy 07/18/2015    Allergies  Allergen Reactions  . Lactose Intolerance (Gi) Diarrhea    Past Surgical History:  Procedure Laterality Date  . ABDOMINAL HYSTERECTOMY  2006   with BSO  . COLONOSCOPY WITH PROPOFOL N/A 10/28/2016   Procedure: COLONOSCOPY WITH PROPOFOL;  Surgeon: Jonathon Bellows, MD;  Location: [facility] ENDOSCOPY;  Service: Endoscopy;  Laterality: N/A;  . LITHOTRIPSY      Social History   Tobacco Use  . Smoking status: Never Smoker  . Smokeless tobacco: Never Used  Vaping Use  . Vaping Use: Never used  Substance Use Topics  . Alcohol use: Yes    Alcohol/week: 0.0 standard drinks  Comment: socially  . Drug use: No     Medication list has been reviewed and updated.  Current Meds  Medication Sig  . fluticasone (FLONASE) 50 MCG/ACT nasal spray ADMINISTER 2 SPRAYS INTO EACH NOSTRIL 1 (ONE) TIME EACH DAY.  . Lactobacillus (ACIDOPHILUS) CAPS capsule Take 1 capsule by mouth as needed.   . Multiple Vitamin (MULTIVITAMIN) tablet  Take 1 tablet by mouth daily.  . Prasterone (INTRAROSA) 6.5 MG INST Place 1 application vaginally at bedtime.    PHQ 2/9 Scores 09/04/2020 03/17/2018  PHQ - 2 Score 2 0  PHQ- 9 Score 5 -    GAD 7 : Generalized Anxiety Score 09/04/2020  Nervous, Anxious, on Edge 1  Control/stop worrying 1  Worry too much - different things 1  Trouble relaxing 0  Restless 0  Easily annoyed or irritable 0  Afraid - awful might happen 0  Total GAD 7 Score 3  Anxiety Difficulty Somewhat difficult    BP Readings from Last 3 Encounters:  09/04/20 124/78  08/31/19 108/68  08/20/18 111/70    Physical Exam Vitals and nursing note reviewed.  Constitutional:      General: She is not in acute distress.    Appearance: She is well-developed.  HENT:     Head: Normocephalic and atraumatic.     Right Ear: Tympanic membrane and ear canal normal.     Left Ear: Tympanic membrane and ear canal normal.     Nose:     Right Sinus: No maxillary sinus tenderness.     Left Sinus: No maxillary sinus tenderness.  Eyes:     General: No scleral icterus.       Right eye: No discharge.        Left eye: No discharge.     Conjunctiva/sclera: Conjunctivae normal.  Neck:     Thyroid: No thyromegaly.     Vascular: No carotid bruit.  Cardiovascular:     Rate and Rhythm: Normal rate and regular rhythm.     Pulses: Normal pulses.     Heart sounds: Normal heart sounds.  Pulmonary:     Effort: Pulmonary effort is normal. No respiratory distress.     Breath sounds: No wheezing.  Chest:     Breasts:        Right: No mass, nipple discharge, skin change or tenderness.        Left: No mass, nipple discharge, skin change or tenderness.  Abdominal:     General: Bowel sounds are normal.     Palpations: Abdomen is soft.     Tenderness: There is no abdominal tenderness.  Musculoskeletal:        General: Normal range of motion.     Cervical back: Normal range of motion. No erythema.     Right lower leg: No edema.     Left  lower leg: No edema.  Lymphadenopathy:     Cervical: No cervical adenopathy.  Skin:    General: Skin is warm and dry.     Capillary Refill: Capillary refill takes less than 2 seconds.     Findings: No rash.  Neurological:     General: No focal deficit present.     Mental Status: She is alert and oriented to person, place, and time.     Cranial Nerves: No cranial nerve deficit.     Sensory: No sensory deficit.     Deep Tendon Reflexes: Reflexes are normal and symmetric.  Psychiatric:        Attention and  Perception: Attention normal.        Mood and Affect: Mood normal.     Wt Readings from Last 3 Encounters:  09/04/20 127 lb (57.6 kg)  08/31/19 127 lb (57.6 kg)  08/20/18 128 lb (58.1 kg)    BP 124/78   Pulse 83   Temp 98.1 F (36.7 C) (Oral)   Ht 5\' 3"  (1.6 m)   Wt 127 lb (57.6 kg)   SpO2 98%   BMI 22.50 kg/m   Assessment and Plan: 1. Annual physical exam Normal exam Continue healthy diet, regular exercise Mild depression/anxiety since her brother passed away this summer - getting support through her employer - CBC with Differential/Platelet - TSH - POCT urinalysis dipstick  2. Encounter for screening mammogram for breast cancer Scheduled next month  3. Hyperlipidemia, mixed Check labs and advise - Comprehensive metabolic panel - Lipid panel  4. Vaginal atrophy - Prasterone (INTRAROSA) 6.5 MG INST; Place 1 application vaginally at bedtime.  Dispense: 28 each; Refill: 12   Partially dictated using Editor, commissioning. Any errors are unintentional.  Halina Maidens, MD [facility]  09/04/2020

## 2020-09-04 NOTE — Patient Instructions (Addendum)
If you decide to get the shingles vaccine, call here to schedule a Nurse visit for the first dose.  This can be done within a month of any other vaccination.

## 2020-09-05 LAB — CBC WITH DIFFERENTIAL/PLATELET
Basophils Absolute: 0 10*3/uL (ref 0.0–0.2)
Basos: 1 %
EOS (ABSOLUTE): 0 10*3/uL (ref 0.0–0.4)
Eos: 0 %
Hematocrit: 38.7 % (ref 34.0–46.6)
Hemoglobin: 12.9 g/dL (ref 11.1–15.9)
Immature Grans (Abs): 0 10*3/uL (ref 0.0–0.1)
Immature Granulocytes: 0 %
Lymphocytes Absolute: 2.6 10*3/uL (ref 0.7–3.1)
Lymphs: 34 %
MCH: 28.8 pg (ref 26.6–33.0)
MCHC: 33.3 g/dL (ref 31.5–35.7)
MCV: 86 fL (ref 79–97)
Monocytes Absolute: 0.6 10*3/uL (ref 0.1–0.9)
Monocytes: 8 %
Neutrophils Absolute: 4.5 10*3/uL (ref 1.4–7.0)
Neutrophils: 57 %
Platelets: 256 10*3/uL (ref 150–450)
RBC: 4.48 x10E6/uL (ref 3.77–5.28)
RDW: 12.6 % (ref 11.7–15.4)
WBC: 7.9 10*3/uL (ref 3.4–10.8)

## 2020-09-05 LAB — COMPREHENSIVE METABOLIC PANEL
ALT: 11 IU/L (ref 0–32)
AST: 14 IU/L (ref 0–40)
Albumin/Globulin Ratio: 2.1 (ref 1.2–2.2)
Albumin: 4.8 g/dL (ref 3.8–4.8)
Alkaline Phosphatase: 89 IU/L (ref 44–121)
BUN/Creatinine Ratio: 14 (ref 12–28)
BUN: 13 mg/dL (ref 8–27)
Bilirubin Total: 0.8 mg/dL (ref 0.0–1.2)
CO2: 23 mmol/L (ref 20–29)
Calcium: 9.8 mg/dL (ref 8.7–10.3)
Chloride: 102 mmol/L (ref 96–106)
Creatinine, Ser: 0.92 mg/dL (ref 0.57–1.00)
GFR calc Af Amer: 78 mL/min/{1.73_m2} (ref >59)
GFR calc non Af Amer: 67 mL/min/{1.73_m2} (ref >59)
Globulin, Total: 2.3 g/dL (ref 1.5–4.5)
Glucose: 79 mg/dL (ref 65–99)
Potassium: 4 mmol/L (ref 3.5–5.2)
Sodium: 141 mmol/L (ref 134–144)
Total Protein: 7.1 g/dL (ref 6.0–8.5)

## 2020-09-05 LAB — LIPID PANEL
Chol/HDL Ratio: 2.7 ratio (ref 0.0–4.4)
Cholesterol, Total: 269 mg/dL — ABNORMAL HIGH (ref 100–199)
HDL: 100 mg/dL (ref >39)
LDL Chol Calc (NIH): 158 mg/dL — ABNORMAL HIGH (ref 0–99)
Triglycerides: 69 mg/dL (ref 0–149)
VLDL Cholesterol Cal: 11 mg/dL (ref 5–40)

## 2020-09-05 LAB — TSH: TSH: 1.53 u[IU]/mL (ref 0.450–4.500)

## 2020-09-28 ENCOUNTER — Other Ambulatory Visit: Payer: Self-pay

## 2020-09-28 ENCOUNTER — Ambulatory Visit
Admission: RE | Admit: 2020-09-28 | Discharge: 2020-09-28 | Disposition: A | Discharge status: Home / Self Care | Payer: BC Managed Care – PPO | Source: Ambulatory Visit | Attending: Internal Medicine | Admitting: Internal Medicine

## 2020-09-28 DIAGNOSIS — Z1231 Encounter for screening mammogram for malignant neoplasm of breast: Secondary | ICD-10-CM | POA: Diagnosis not present

## 2020-10-04 ENCOUNTER — Telehealth: Payer: Self-pay

## 2020-10-04 NOTE — Telephone Encounter (Signed)
Copied from [place] [phone]. Topic: General - Other >> Oct 04, 2020  9:35 AM Yvette Rack wrote: Reason for CRM: Pt stated she was informed by her pharmacy that the Rx for Prasterone (INTRAROSA) 6.5 MG INST needs prior authorization. Cb# [phone]

## 2020-10-04 NOTE — Telephone Encounter (Signed)
Completed PA on [email or web address].  Awaiting outcome.  (Key: [id number])  "Your information has been submitted to [place]. [employer] will review the request and notify you of the determination decision directly, typically within 72 hours of receiving all information.  You will also receive your request decision electronically. To check for an update later, open this request again from your dashboard."

## 2020-10-04 NOTE — Telephone Encounter (Signed)
Completed PA on covermymeds and awaiting outcomes.

## 2020-10-16 NOTE — Telephone Encounter (Signed)
Medication for Lindsay Howell is DENIED by insurance. The patient needs to take Estradiol vaginal cream instead per insurance company denial letter. Will message patient to inform her of this denial.

## 2020-10-17 ENCOUNTER — Other Ambulatory Visit: Payer: Self-pay

## 2020-10-17 MED ORDER — ESTRADIOL 0.1 MG/GM VA CREA: 1.0000 | TOPICAL_CREAM | Freq: Every day | VAGINAL | 12 refills | Status: DC

## 2020-11-22 DIAGNOSIS — Z90710 Acquired absence of both cervix and uterus: Secondary | ICD-10-CM | POA: Insufficient documentation

## 2021-02-27 ENCOUNTER — Ambulatory Visit
Admission: EM | Admit: 2021-02-27 | Discharge: 2021-02-27 | Disposition: A | Discharge status: Home / Self Care | Payer: BC Managed Care – PPO | Attending: Emergency Medicine | Admitting: Emergency Medicine

## 2021-02-27 ENCOUNTER — Encounter: Payer: Self-pay | Admitting: Emergency Medicine

## 2021-02-27 ENCOUNTER — Ambulatory Visit (INDEPENDENT_AMBULATORY_CARE_PROVIDER_SITE_OTHER)
Admit: 2021-02-27 | Discharge: 2021-02-27 | Disposition: A | Discharge status: Home / Self Care | Payer: BC Managed Care – PPO | Attending: Emergency Medicine | Admitting: Emergency Medicine

## 2021-02-27 DIAGNOSIS — K2900 Acute gastritis without bleeding: Secondary | ICD-10-CM | POA: Diagnosis not present

## 2021-02-27 DIAGNOSIS — R109 Unspecified abdominal pain: Secondary | ICD-10-CM | POA: Insufficient documentation

## 2021-02-27 DIAGNOSIS — R1032 Left lower quadrant pain: Secondary | ICD-10-CM

## 2021-02-27 DIAGNOSIS — R1030 Lower abdominal pain, unspecified: Secondary | ICD-10-CM

## 2021-02-27 DIAGNOSIS — K449 Diaphragmatic hernia without obstruction or gangrene: Secondary | ICD-10-CM | POA: Diagnosis not present

## 2021-02-27 DIAGNOSIS — K6389 Other specified diseases of intestine: Secondary | ICD-10-CM | POA: Diagnosis not present

## 2021-02-27 DIAGNOSIS — K769 Liver disease, unspecified: Secondary | ICD-10-CM | POA: Diagnosis not present

## 2021-02-27 DIAGNOSIS — K3189 Other diseases of stomach and duodenum: Secondary | ICD-10-CM | POA: Diagnosis not present

## 2021-02-27 LAB — CBC WITH DIFFERENTIAL/PLATELET
Abs Immature Granulocytes: 0.02 10*3/uL (ref 0.00–0.07)
Basophils Absolute: 0 10*3/uL (ref 0.0–0.1)
Basophils Relative: 0 %
Eosinophils Absolute: 0 10*3/uL (ref 0.0–0.5)
Eosinophils Relative: 0 %
HCT: 37.8 % (ref 36.0–46.0)
Hemoglobin: 12.4 g/dL (ref 12.0–15.0)
Immature Granulocytes: 0 %
Lymphocytes Relative: 12 %
Lymphs Abs: 1.4 10*3/uL (ref 0.7–4.0)
MCH: 28.5 pg (ref 26.0–34.0)
MCHC: 32.8 g/dL (ref 30.0–36.0)
MCV: 86.9 fL (ref 80.0–100.0)
Monocytes Absolute: 0.3 10*3/uL (ref 0.1–1.0)
Monocytes Relative: 3 %
Neutro Abs: 9.6 10*3/uL — ABNORMAL HIGH (ref 1.7–7.7)
Neutrophils Relative %: 85 %
Platelets: 255 10*3/uL (ref 150–400)
RBC: 4.35 MIL/uL (ref 3.87–5.11)
RDW: 13.1 % (ref 11.5–15.5)
WBC: 11.3 10*3/uL — ABNORMAL HIGH (ref 4.0–10.5)
nRBC: 0 % (ref 0.0–0.2)

## 2021-02-27 LAB — COMPREHENSIVE METABOLIC PANEL
ALT: 18 U/L (ref 0–44)
AST: 17 U/L (ref 15–41)
Albumin: 4.7 g/dL (ref 3.5–5.0)
Alkaline Phosphatase: 68 U/L (ref 38–126)
Anion gap: 11 (ref 5–15)
BUN: 15 mg/dL (ref 8–23)
CO2: 26 mmol/L (ref 22–32)
Calcium: 10 mg/dL (ref 8.9–10.3)
Chloride: 104 mmol/L (ref 98–111)
Creatinine, Ser: 0.9 mg/dL (ref 0.44–1.00)
GFR, Estimated: >60 mL/min (ref >60)
Glucose, Bld: 131 mg/dL — ABNORMAL HIGH (ref 70–99)
Potassium: 4.2 mmol/L (ref 3.5–5.1)
Sodium: 141 mmol/L (ref 135–145)
Total Bilirubin: 1.1 mg/dL (ref 0.3–1.2)
Total Protein: 8.1 g/dL (ref 6.5–8.1)

## 2021-02-27 LAB — LIPASE, BLOOD: Lipase: 34 U/L (ref 11–51)

## 2021-02-27 MED ORDER — ONDANSETRON 8 MG PO TBDP: 8.0000 mg | ORAL_TABLET | Freq: Three times a day (TID) | ORAL | 0 refills | Status: DC | PRN

## 2021-02-27 MED ORDER — SODIUM CHLORIDE 0.9 % IV BOLUS
1000.0000 mL | Freq: Once | INTRAVENOUS | Status: AC
  Administered 2021-02-27: 1000 mL via INTRAVENOUS

## 2021-02-27 MED ORDER — LIDOCAINE VISCOUS HCL 2 % MT SOLN
15.0000 mL | Freq: Once | OROMUCOSAL | Status: AC
  Administered 2021-02-27: 15 mL via ORAL

## 2021-02-27 MED ORDER — DICYCLOMINE HCL 20 MG PO TABS: 20.0000 mg | ORAL_TABLET | Freq: Two times a day (BID) | ORAL | 0 refills | Status: DC

## 2021-02-27 MED ORDER — SODIUM CHLORIDE 0.9 % IV SOLN
12.5000 mg | Freq: Once | INTRAVENOUS | Status: AC
  Administered 2021-02-27: 12.5 mg via INTRAVENOUS

## 2021-02-27 MED ORDER — ALUM & MAG HYDROXIDE-SIMETH 200-200-20 MG/5ML PO SUSP
30.0000 mL | Freq: Once | ORAL | Status: AC
  Administered 2021-02-27: 30 mL via ORAL

## 2021-02-27 NOTE — ED Triage Notes (Addendum)
Patient states she ate [place] around 1pm and around 2pm she started having severe abdominal pain in the middle of her abdomen to the lower part of her abdomen. Patient reports vomiting x 1 a few minutes ago.

## 2021-02-27 NOTE — ED Provider Notes (Signed)
[facility]    CSN: [id number] Arrival date & time: 02/27/21  1647      History   Chief Complaint Chief Complaint  Patient presents with  . Abdominal Pain    HPI Lindsay Howell is a 60 y.o. female.   HPI   60 year old female here for evaluation of abdominal pain.  Patient reports that she ate [place] food around 1 PM and at 2 PM she developed severe abdominal pain.  She reports that the pain is sharp in nature, is in the epigastric area, and has been associated with nausea and vomiting.  Patient rates her pain as a 10/10.  She denies fever or diarrhea.  Past Medical History:  Diagnosis Date  . Anemia   . Dyspareunia   . Fibroid   . History of kidney stones   . Menopause   . Nephrolithiasis   . Postmenopausal atrophic vaginitis   . Vaginal atrophy     Patient Active Problem List   Diagnosis Date Noted  . Status post hysterectomy 11/22/2020  . Dyspareunia, female 08/20/2018  . Hyperlipidemia, mixed 03/17/2018  . Migraine with aura and without status migrainosus, not intractable 01/07/2018  . Status post TAH-BSO 07/24/2016  . Menopause 07/18/2015  . Vaginal atrophy 07/18/2015    Past Surgical History:  Procedure Laterality Date  . ABDOMINAL HYSTERECTOMY  2006   with BSO  . COLONOSCOPY WITH PROPOFOL N/A 10/28/2016   Procedure: COLONOSCOPY WITH PROPOFOL;  Surgeon: Jonathon Bellows, MD;  Location: [facility] END[facility]COPY;  Service: Endoscopy;  Laterality: N/A;  . LITHOTRIPSY      OB History    Gravida  2   Para      Term      Preterm      AB  2   Living        SAB  2   IAB      Ectopic      Multiple      Live Births               Home Medications    Prior to Admission medications   Medication Sig Start Date End Date Taking? Authorizing Provider  dicyclomine (BENTYL) 20 MG tablet Take 1 tablet (20 mg total) by mouth 2 (two) times daily. 02/27/21  Yes Margarette Canada, NP  estradiol (ESTRACE VAGINAL) 0.1 MG/GM vaginal cream Place 1  Applicatorful vaginally at bedtime. 10/17/20  Yes Glean Hess, MD  Multiple Vitamin (MULTIVITAMIN) tablet Take 1 tablet by mouth daily.   Yes [provider]  ondansetron (ZOFRAN ODT) 8 MG disintegrating tablet Take 1 tablet (8 mg total) by mouth every 8 (eight) hours as needed for nausea or vomiting. 02/27/21  Yes Margarette Canada, NP  fluticasone (FLONASE) 50 MCG/ACT nasal spray ADMINISTER 2 SPRAYS INTO EACH NOSTRIL 1 (ONE) TIME EACH DAY. 02/25/18 02/27/21  [provider]    Family History Family History  Problem Relation Age of Onset  . Aneurysm Mother   . Lung cancer Father 22  . Cancer Neg Hx   . Diabetes Neg Hx   . Heart disease Neg Hx   . Breast cancer Neg Hx     Social History Social History   Tobacco Use  . Smoking status: Never Smoker  . Smokeless tobacco: Never Used  Vaping Use  . Vaping Use: Never used  Substance Use Topics  . Alcohol use: Yes    Alcohol/week: 0.0 standard drinks    Comment: socially  . Drug use: No  Allergies   Lactose intolerance (gi)   Review of Systems Review of Systems  Constitutional: Negative for activity change, appetite change and fever.  Gastrointestinal: Positive for abdominal pain, nausea and vomiting. Negative for diarrhea.  Skin: Negative for rash.  Hematological: Negative.   Psychiatric/Behavioral: Negative.      Physical Exam Triage Vital Signs ED Triage Vitals  Enc Vitals Group     BP 02/27/21 1803 (!) 123/55     Pulse Rate 02/27/21 1803 72     Resp 02/27/21 1803 18     Temp 02/27/21 1803 98.4 F (36.9 C)     Temp Source 02/27/21 1803 Oral     SpO2 02/27/21 1803 100 %     Weight 02/27/21 1802 126 lb 15.8 oz (57.6 kg)     Height 02/27/21 1802 5\' 3"  (1.6 m)     Head Circumference --      Peak Flow --      Pain Score 02/27/21 1801 10     Pain Loc --      Pain Edu? --      Excl. in [place]? --    No data found.  Updated Vital Signs BP (!) 123/55 (BP Location: Right Arm)   Pulse 72   Temp  98.4 F (36.9 C) (Oral)   Resp 18   Ht 5\' 3"  (1.6 m)   Wt 126 lb 15.8 oz (57.6 kg)   SpO2 100%   BMI 22.49 kg/m   Visual Acuity Right Eye Distance:   Left Eye Distance:   Bilateral Distance:    Right Eye Near:   Left Eye Near:    Bilateral Near:     Physical Exam Vitals and nursing note reviewed.  Constitutional:      General: She is in acute distress.     Appearance: She is well-developed and normal weight.  HENT:     Head: Normocephalic and atraumatic.  Cardiovascular:     Rate and Rhythm: Normal rate and regular rhythm.     Heart sounds: Normal heart sounds. No murmur heard. No gallop.   Pulmonary:     Effort: Pulmonary effort is normal.     Breath sounds: Normal breath sounds. No wheezing, rhonchi or rales.  Abdominal:     General: Abdomen is flat. Bowel sounds are decreased.     Tenderness: There is abdominal tenderness in the epigastric area, left upper quadrant and left lower quadrant. There is no rebound.  Skin:    General: Skin is warm and dry.     Capillary Refill: Capillary refill takes less than 2 seconds.     Findings: No erythema.  Neurological:     General: No focal deficit present.     Mental Status: She is alert and oriented to person, place, and time.  Psychiatric:        Mood and Affect: Mood normal.        Behavior: Behavior normal.      UC Treatments / Results  Labs (all labs ordered are listed, but only abnormal results are displayed) Labs Reviewed  CBC WITH DIFFERENTIAL/PLATELET - Abnormal; Notable for the following components:      Result Value   WBC 11.3 (*)    Neutro Abs 9.6 (*)    All other components within normal limits  COMPREHENSIVE METABOLIC PANEL - Abnormal; Notable for the following components:   Glucose, Bld 131 (*)    All other components within normal limits  LIPASE, BLOOD    EKG  Radiology CT ABDOMEN PELVIS WO CONTRAST  Result Date: 02/27/2021 CLINICAL DATA:  LLQ abdominal pain EXAM: CT ABDOMEN AND PELVIS  WITHOUT CONTRAST TECHNIQUE: Multidetector CT imaging of the abdomen and pelvis was performed following the standard protocol without IV contrast. COMPARISON:  CT May 26, 2006. FINDINGS: Lower chest: No acute abnormality. Normal size heart. No significant pericardial effusion/thickening. Hepatobiliary: Hypodense 6 mm lesion in the right lobe of the liver on image 19/2 which is technically too small to accurately characterize but favored represent a benign etiology such as cyst/hemangioma. Otherwise unremarkable noncontrast appearance of the hepatic parenchyma. Gallbladder is grossly unremarkable. No biliary ductal dilation. Pancreas: Within normal limits. Spleen: Within normal limits. Adrenals/Urinary Tract: Adrenal glands are unremarkable. Kidneys are normal, without renal calculi, contour deforming lesion, or hydronephrosis. Bladder is unremarkable. Stomach/Bowel: Stomach is distended with enteric contents, with small hiatal hernia but otherwise unremarkable. No pathologic dilation of small bowel. The appendix is not definitely visualized however there is no CT findings to suggest acute appendicitis. No suspicious colonic wall thickening or mass like lesions. Vascular/Lymphatic: No significant vascular findings are present. No enlarged abdominal or pelvic lymph nodes. Reproductive: Unremarkable Other: No abdominopelvic ascites or pneumoperitoneum. Musculoskeletal: No acute or significant osseous findings. IMPRESSION: 1. No acute abdominopelvic findings. 2. Small hiatal hernia. Electronically Signed   By: Dahlia Bailiff MD   On: 02/27/2021 19:15    Procedures Procedures (including critical care time)  Medications Ordered in UC Medications  sodium chloride 0.9 % bolus 1,000 mL (1,000 mLs Intravenous New Bag/Given 02/27/21 1901)  promethazine (PHENERGAN) 12.5 mg in sodium chloride 0.9 % 50 mL IVPB (0 mg Intravenous Stopped 02/27/21 1902)  alum & mag hydroxide-simeth (MAALOX/MYLANTA) 200-200-20 MG/5ML  suspension 30 mL (30 mLs Oral Given 02/27/21 1929)    And  lidocaine (XYLOCAINE) 2 % viscous mouth solution 15 mL (15 mLs Oral Given 02/27/21 1929)    Initial Impression / Assessment and Plan / UC Course  I have reviewed the triage vital signs and the nursing notes.  Pertinent labs & imaging results that were available during my care of the patient were reviewed by me and considered in my medical decision making (see chart for details).   Patient is a very pleasant 60 year old female who appears to be in a great deal of pain who presents for evaluation of abdominal pain that started suddenly at 2 PM.  She is rating her pain is a 10/10 and states that occasionally will go up to a 12/10.  She has had 1 episode of nausea vomiting while in her waiting room but she denies diarrhea.  Patient also denies fever.  Physical exam reveals a benign cardiopulmonary exam.  Abdomen is firm, tender the epigastrium, left upper quadrant, and left lower quadrant.  Bowel sounds are decreased diffusely.  Patient has no rebound or guarding.  Will obtain CBC, CMP, lipase, and CT the abdomen pelvis without contrast.  CBC shows a mildly elevated white blood cell count of 11.3 with elevated neutrophil count of 9.6.  CMP shows an elevated glucose of 131 but is otherwise unremarkable.  Lipase is 34.  Radiology read of CT scan of abdomen pelvis is that it is negative for any acute process.  Reassessment of patient after IV fluids, CT scan, blood work reveals patient who reports that her pain is slightly better at a 9/10.  Wheezes and abdomen reveals a soft abdomen with epigastric tenderness.  Patient longer tenderness in left upper or left lower quadrant of her abdomen.  We will give patient a GI cocktail and see if that helps with her symptoms.  Suspect patient has gastritis secondary to something she ate.  Patient reports that she was not able to tolerate the GI cocktail and threw it up approximately 1 minute after taking  it.  She does report that she thinks what she was able to retain has helped her symptoms and rates her pain as a 7/10 at this point.  Will discharge patient home with Bentyl to help with stomach spasm, Zofran to help with nausea, and have her follow-up bland diet.  ER precautions reviewed with patient.  Final Clinical Impressions(s) / UC Diagnoses   Final diagnoses:  Acute gastritis without hemorrhage, unspecified gastritis type     Discharge Instructions     Use the Zofran every 8 hours as needed for nausea and vomiting.  Use the Bentyl (dicyclomine) every 6 hours as needed for abdominal pain and spasm.  Take Pepcid 20 mg over-the-counter twice daily to help cut down stomach acid production and decreased gastric irritation.  Follow a bland diet for the next 1 to 2 days to decrease stress on your stomach to also hopefully decrease gastric irritation.  Bland foods are things such as bananas, rice, applesauce, and toast.  If you start running any fevers or have a sharp increase in your abdominal pain you need to go to the ER for evaluation.    ED Prescriptions    Medication Sig Dispense Auth. Provider   dicyclomine (BENTYL) 20 MG tablet Take 1 tablet (20 mg total) by mouth 2 (two) times daily. 20 tablet Margarette Canada, NP   ondansetron (ZOFRAN ODT) 8 MG disintegrating tablet Take 1 tablet (8 mg total) by mouth every 8 (eight) hours as needed for nausea or vomiting. 20 tablet Margarette Canada, NP     PDMP not reviewed this encounter.   Margarette Canada, NP 02/27/21 1955

## 2021-02-27 NOTE — Discharge Instructions (Addendum)
Use the Zofran every 8 hours as needed for nausea and vomiting.  Use the Bentyl (dicyclomine) every 6 hours as needed for abdominal pain and spasm.  Take Pepcid 20 mg over-the-counter twice daily to help cut down stomach acid production and decreased gastric irritation.  Follow a bland diet for the next 1 to 2 days to decrease stress on your stomach to also hopefully decrease gastric irritation.  Bland foods are things such as bananas, rice, applesauce, and toast.  If you start running any fevers or have a sharp increase in your abdominal pain you need to go to the ER for evaluation.

## 2021-09-12 ENCOUNTER — Encounter: Payer: Self-pay | Admitting: Internal Medicine

## 2021-09-12 ENCOUNTER — Other Ambulatory Visit: Payer: Self-pay

## 2021-09-12 ENCOUNTER — Ambulatory Visit (INDEPENDENT_AMBULATORY_CARE_PROVIDER_SITE_OTHER): Payer: BC Managed Care – PPO | Admitting: Internal Medicine

## 2021-09-12 VITALS — BP 102/58 | HR 68 | Temp 98.0°F | Ht 63.0 in | Wt 115.0 lb

## 2021-09-12 DIAGNOSIS — E782 Mixed hyperlipidemia: Secondary | ICD-10-CM | POA: Diagnosis not present

## 2021-09-12 DIAGNOSIS — Z0000 Encounter for general adult medical examination without abnormal findings: Secondary | ICD-10-CM

## 2021-09-12 DIAGNOSIS — Z23 Encounter for immunization: Secondary | ICD-10-CM

## 2021-09-12 DIAGNOSIS — Z1231 Encounter for screening mammogram for malignant neoplasm of breast: Secondary | ICD-10-CM

## 2021-09-12 DIAGNOSIS — R300 Dysuria: Secondary | ICD-10-CM | POA: Diagnosis not present

## 2021-09-12 DIAGNOSIS — N952 Postmenopausal atrophic vaginitis: Secondary | ICD-10-CM

## 2021-09-12 DIAGNOSIS — G43109 Migraine with aura, not intractable, without status migrainosus: Secondary | ICD-10-CM | POA: Diagnosis not present

## 2021-09-12 LAB — POC URINALYSIS WITH MICROSCOPIC (NON AUTO)MANUAL RESULT
Bilirubin, UA: NEGATIVE
Crystals: 0
Glucose, UA: NEGATIVE
Ketones, UA: NEGATIVE
Mucus, UA: 0
Nitrite, UA: NEGATIVE
Protein, UA: NEGATIVE
RBC: 2 M/uL — AB (ref 4.04–5.48)
Spec Grav, UA: 1.015 (ref 1.010–1.025)
Urobilinogen, UA: 0.2 E.U./dL
WBC Casts, UA: 20
pH, UA: 6.5 (ref 5.0–8.0)

## 2021-09-12 MED ORDER — ESTRADIOL 0.1 MG/GM VA CREA: 1.0000 | TOPICAL_CREAM | Freq: Every day | VAGINAL | 12 refills | Status: DC

## 2021-09-12 MED ORDER — NITROFURANTOIN MONOHYD MACRO 100 MG PO CAPS: 100.0000 mg | ORAL_CAPSULE | Freq: Two times a day (BID) | ORAL | 0 refills | Status: AC

## 2021-09-12 NOTE — Progress Notes (Signed)
Date:  09/12/2021   Name:  Lindsay Howell   DOB:  Jul 30, 1961   MRN:  [id number]   Chief Complaint: Annual Exam (Breast exam no pap ) and Flu Vaccine Lindsay Howell is a 60 y.o. female who presents today for her Complete Annual Exam. She feels well. She reports exercising walking 30 mins daily 4-5 days. She reports she is sleeping well. Breast complaints none.  Mammogram: 09/2020 DEXA: none Pap smear: discontinued Colonoscopy: 10/2016 repeat 10 yrs  Immunization History  Administered Date(s) Administered   PFIZER(Purple Top)SARS-COV-2 Vaccination 12/29/2019, 01/19/2020    Migraine  This is a recurrent problem. Episode frequency: 1-2 times per year. The pain quality is similar to prior headaches. Pertinent negatives include no abdominal pain, coughing, dizziness, fever, hearing loss, tinnitus or vomiting.   Lab Results  Component Value Date   NA 141 02/27/2021   K 4.2 02/27/2021   CO2 26 02/27/2021   GLUCOSE 131 (H) 02/27/2021   BUN 15 02/27/2021   CREATININE 0.90 02/27/2021   CALCIUM 10.0 02/27/2021   GFRNONAA >60 02/27/2021   Lab Results  Component Value Date   CHOL 269 (H) 09/04/2020   HDL 100 09/04/2020   LDLCALC 158 (H) 09/04/2020   TRIG 69 09/04/2020   CHOLHDL 2.7 09/04/2020   Lab Results  Component Value Date   TSH 1.530 09/04/2020   No results found for: HGBA1C Lab Results  Component Value Date   WBC 11.3 (H) 02/27/2021   HGB 12.4 02/27/2021   HCT 37.8 02/27/2021   MCV 86.9 02/27/2021   PLT 255 02/27/2021   Lab Results  Component Value Date   ALT 18 02/27/2021   AST 17 02/27/2021   ALKPHOS 68 02/27/2021   BILITOT 1.1 02/27/2021   No results found for: 25OHVITD2, 25OHVITD3, VD25OH   Review of Systems  Constitutional:  Negative for chills, fatigue and fever.  HENT:  Negative for congestion, hearing loss, tinnitus, trouble swallowing and voice change.   Eyes:  Negative for visual disturbance.  Respiratory:  Negative for cough, chest tightness,  shortness of breath and wheezing.   Cardiovascular:  Negative for chest pain, palpitations and leg swelling.  Gastrointestinal:  Negative for abdominal pain, constipation, diarrhea and vomiting.  Endocrine: Negative for polydipsia and polyuria.  Genitourinary:  Positive for dysuria. Negative for frequency, genital sores, hematuria, urgency, vaginal bleeding and vaginal discharge.  Musculoskeletal:  Negative for arthralgias, gait problem and joint swelling.  Skin:  Negative for color change and rash.  Neurological:  Negative for dizziness, tremors, light-headedness and headaches.  Hematological:  Negative for adenopathy. Does not bruise/bleed easily.  Psychiatric/Behavioral:  Negative for dysphoric mood and sleep disturbance. The patient is not nervous/anxious.    Patient Active Problem List   Diagnosis Date Noted   Status post hysterectomy 11/22/2020   Dyspareunia, female 08/20/2018   Hyperlipidemia, mixed 03/17/2018   Migraine with aura and without status migrainosus, not intractable 01/07/2018   Status post TAH-BSO 07/24/2016   Menopause 07/18/2015   Vaginal atrophy 07/18/2015    Allergies  Allergen Reactions   Lactose Intolerance (Gi) Diarrhea    Past Surgical History:  Procedure Laterality Date   ABDOMINAL HYSTERECTOMY  2006   with BSO   COLONOSCOPY WITH PROPOFOL N/A 10/28/2016   Procedure: COLONOSCOPY WITH PROPOFOL;  Surgeon: Jonathon Bellows, MD;  Location: [facility] ENDOSCOPY;  Service: Endoscopy;  Laterality: N/A;   LITHOTRIPSY      Social History   Tobacco Use   Smoking status: Never   Smokeless  tobacco: Never  Vaping Use   Vaping Use: Never used  Substance Use Topics   Alcohol use: Yes    Alcohol/week: 0.0 standard drinks    Comment: socially   Drug use: No     Medication list has been reviewed and updated.  Current Meds  Medication Sig   estradiol (ESTRACE VAGINAL) 0.1 MG/GM vaginal cream Place 1 Applicatorful vaginally at bedtime.   Multiple Vitamin  (MULTIVITAMIN) tablet Take 1 tablet by mouth daily.   [DISCONTINUED] dicyclomine (BENTYL) 20 MG tablet Take 1 tablet (20 mg total) by mouth 2 (two) times daily.   [DISCONTINUED] ondansetron (ZOFRAN ODT) 8 MG disintegrating tablet Take 1 tablet (8 mg total) by mouth every 8 (eight) hours as needed for nausea or vomiting.    PHQ 2/9 Scores 09/12/2021 09/04/2020 03/17/2018  PHQ - 2 Score 0 2 0  PHQ- 9 Score 4 5 -    GAD 7 : Generalized Anxiety Score 09/12/2021 09/04/2020  Nervous, Anxious, on Edge 1 1  Control/stop worrying 0 1  Worry too much - different things 0 1  Trouble relaxing 0 0  Restless 0 0  Easily annoyed or irritable 0 0  Afraid - awful might happen 1 0  Total GAD 7 Score 2 3  Anxiety Difficulty Not difficult at all Somewhat difficult    BP Readings from Last 3 Encounters:  09/12/21 (!) 102/58  02/27/21 (!) 123/55  09/04/20 124/78    Physical Exam Vitals and nursing note reviewed.  Constitutional:      General: She is not in acute distress.    Appearance: She is well-developed.  HENT:     Head: Normocephalic and atraumatic.     Right Ear: Tympanic membrane and ear canal normal.     Left Ear: Tympanic membrane and ear canal normal.     Nose:     Right Sinus: No maxillary sinus tenderness.     Left Sinus: No maxillary sinus tenderness.  Eyes:     General: No scleral icterus.       Right eye: No discharge.        Left eye: No discharge.     Conjunctiva/sclera: Conjunctivae normal.  Neck:     Thyroid: No thyromegaly.     Vascular: No carotid bruit.  Cardiovascular:     Rate and Rhythm: Normal rate and regular rhythm.     Pulses: Normal pulses.     Heart sounds: Normal heart sounds.  Pulmonary:     Effort: Pulmonary effort is normal. No respiratory distress.     Breath sounds: No wheezing.  Chest:  Breasts:    Right: No mass, nipple discharge, skin change or tenderness.     Left: No mass, nipple discharge, skin change or tenderness.  Abdominal:      General: Bowel sounds are normal.     Palpations: Abdomen is soft.     Tenderness: There is no abdominal tenderness.  Musculoskeletal:     Cervical back: Normal range of motion. No erythema.     Right lower leg: No edema.     Left lower leg: No edema.  Lymphadenopathy:     Cervical: No cervical adenopathy.  Skin:    General: Skin is warm and dry.     Findings: No rash.  Neurological:     Mental Status: She is alert and oriented to person, place, and time.     Cranial Nerves: No cranial nerve deficit.     Sensory: No sensory deficit.  Deep Tendon Reflexes: Reflexes are normal and symmetric.  Psychiatric:        Attention and Perception: Attention normal.        Mood and Affect: Mood normal.    Wt Readings from Last 3 Encounters:  09/12/21 115 lb (52.2 kg)  02/27/21 126 lb 15.8 oz (57.6 kg)  09/04/20 127 lb (57.6 kg)    BP (!) 102/58   Pulse 68   Temp 98 F (36.7 C) (Oral)   Ht 5\' 3"  (1.6 m)   Wt 115 lb (52.2 kg)   SpO2 99%   BMI 20.37 kg/m   Assessment and Plan: 1. Annual physical exam Normal exam. She has lost 10 lbs since her brother's passing. Discussed healthy diet, Ensure if needed Up to date on screenings and immunizations. - CBC with Differential/Platelet - Comprehensive metabolic panel - Hemoglobin A1c  2. Encounter for screening mammogram for breast cancer Schedule at [facility] - MM 3D SCREEN BREAST BILATERAL  3. Hyperlipidemia, mixed Check labs and advise - Lipid panel - TSH  4. Migraine with aura and without status migrainosus, not intractable Treated with otc meds or rest  5. Vaginal atrophy - estradiol (ESTRACE VAGINAL) 0.1 MG/GM vaginal cream; Place 1 Applicatorful vaginally at bedtime.  Dispense: 42.5 g; Refill: 12  6. Dysuria Increase water intake - POC urinalysis w microscopic (non auto) - nitrofurantoin, macrocrystal-monohydrate, (MACROBID) 100 MG capsule; Take 1 capsule (100 mg total) by mouth 2 (two) times daily for 5 days.   Dispense: 10 capsule; Refill: 0  7. Need for immunization against influenza - Flu Vaccine QUAD 30mo+IM (Fluarix, Fluzone & Alfiuria Quad PF)   Partially dictated using Editor, commissioning. Any errors are unintentional.  Halina Maidens, MD [facility]  09/12/2021

## 2021-09-13 LAB — CBC WITH DIFFERENTIAL/PLATELET
Basophils Absolute: 0 10*3/uL (ref 0.0–0.2)
Basos: 0 %
EOS (ABSOLUTE): 0 10*3/uL (ref 0.0–0.4)
Eos: 0 %
Hematocrit: 37.2 % (ref 34.0–46.6)
Hemoglobin: 12.4 g/dL (ref 11.1–15.9)
Immature Grans (Abs): 0 10*3/uL (ref 0.0–0.1)
Immature Granulocytes: 0 %
Lymphocytes Absolute: 2.9 10*3/uL (ref 0.7–3.1)
Lymphs: 29 %
MCH: 28.4 pg (ref 26.6–33.0)
MCHC: 33.3 g/dL (ref 31.5–35.7)
MCV: 85 fL (ref 79–97)
Monocytes Absolute: 0.7 10*3/uL (ref 0.1–0.9)
Monocytes: 7 %
Neutrophils Absolute: 6.2 10*3/uL (ref 1.4–7.0)
Neutrophils: 64 %
Platelets: 225 10*3/uL (ref 150–450)
RBC: 4.37 x10E6/uL (ref 3.77–5.28)
RDW: 12.6 % (ref 11.7–15.4)
WBC: 9.7 10*3/uL (ref 3.4–10.8)

## 2021-09-13 LAB — COMPREHENSIVE METABOLIC PANEL
ALT: 13 IU/L (ref 0–32)
AST: 13 IU/L (ref 0–40)
Albumin/Globulin Ratio: 2 (ref 1.2–2.2)
Albumin: 4.6 g/dL (ref 3.8–4.8)
Alkaline Phosphatase: 77 IU/L (ref 44–121)
BUN/Creatinine Ratio: 15 (ref 12–28)
BUN: 14 mg/dL (ref 8–27)
Bilirubin Total: 1 mg/dL (ref 0.0–1.2)
CO2: 25 mmol/L (ref 20–29)
Calcium: 9.9 mg/dL (ref 8.7–10.3)
Chloride: 103 mmol/L (ref 96–106)
Creatinine, Ser: 0.92 mg/dL (ref 0.57–1.00)
Globulin, Total: 2.3 g/dL (ref 1.5–4.5)
Glucose: 79 mg/dL (ref 70–99)
Potassium: 4.2 mmol/L (ref 3.5–5.2)
Sodium: 142 mmol/L (ref 134–144)
Total Protein: 6.9 g/dL (ref 6.0–8.5)
eGFR: 70 mL/min/{1.73_m2} (ref >59)

## 2021-09-13 LAB — HEMOGLOBIN A1C
Est. average glucose Bld gHb Est-mCnc: 114 mg/dL
Hgb A1c MFr Bld: 5.6 % (ref 4.8–5.6)

## 2021-09-13 LAB — TSH: TSH: 1.34 u[IU]/mL (ref 0.450–4.500)

## 2021-09-13 LAB — LIPID PANEL
Chol/HDL Ratio: 2.1 ratio (ref 0.0–4.4)
Cholesterol, Total: 264 mg/dL — ABNORMAL HIGH (ref 100–199)
HDL: 125 mg/dL (ref >39)
LDL Chol Calc (NIH): 132 mg/dL — ABNORMAL HIGH (ref 0–99)
Triglycerides: 44 mg/dL (ref 0–149)
VLDL Cholesterol Cal: 7 mg/dL (ref 5–40)

## 2021-10-02 ENCOUNTER — Ambulatory Visit
Admission: RE | Admit: 2021-10-02 | Discharge: 2021-10-02 | Disposition: A | Discharge status: Home / Self Care | Payer: BC Managed Care – PPO | Source: Ambulatory Visit | Attending: Internal Medicine | Admitting: Internal Medicine

## 2021-10-02 ENCOUNTER — Other Ambulatory Visit: Payer: Self-pay

## 2021-10-02 DIAGNOSIS — Z1231 Encounter for screening mammogram for malignant neoplasm of breast: Secondary | ICD-10-CM | POA: Diagnosis not present

## 2022-04-11 DIAGNOSIS — J029 Acute pharyngitis, unspecified: Secondary | ICD-10-CM | POA: Diagnosis not present

## 2022-04-11 DIAGNOSIS — J020 Streptococcal pharyngitis: Secondary | ICD-10-CM | POA: Diagnosis not present

## 2022-09-16 ENCOUNTER — Encounter: Payer: Self-pay | Admitting: Internal Medicine

## 2022-09-16 ENCOUNTER — Ambulatory Visit (INDEPENDENT_AMBULATORY_CARE_PROVIDER_SITE_OTHER): Payer: BC Managed Care – PPO | Admitting: Internal Medicine

## 2022-09-16 VITALS — BP 126/62 | HR 68 | Ht 63.0 in | Wt 120.8 lb

## 2022-09-16 DIAGNOSIS — E782 Mixed hyperlipidemia: Secondary | ICD-10-CM

## 2022-09-16 DIAGNOSIS — Z0000 Encounter for general adult medical examination without abnormal findings: Secondary | ICD-10-CM | POA: Diagnosis not present

## 2022-09-16 DIAGNOSIS — Z1231 Encounter for screening mammogram for malignant neoplasm of breast: Secondary | ICD-10-CM

## 2022-09-16 MED ORDER — DICYCLOMINE HCL 20 MG PO TABS: 20.0000 mg | ORAL_TABLET | Freq: Two times a day (BID) | ORAL | 1 refills | Status: DC | PRN

## 2022-09-16 NOTE — Patient Instructions (Addendum)
Call [facility] to schedule your mammogram at [phone].  Meaningful Use Checks   Communications   Method of Visit    Flowsheets Office Visit from 09/16/2022 in [place] [facility] at [facility] 09/16/2022 0757 Visit Method Type of Visit In-person

## 2022-09-16 NOTE — Progress Notes (Signed)
Date:  09/16/2022   Name:  Lindsay Howell   DOB:  [birth year]   MRN:  [id number]   Chief Complaint: Annual Exam Lindsay Howell is a 61 y.o. female who presents today for her Complete Annual Exam. She feels well. She reports exercising. She reports she is sleeping fairly well. Breast complaints - none.  Mammogram: 09/2021 DEXA: none Pap smear: discontinued Colonoscopy: 10/2016  Health Maintenance Due  Topic Date Due   HIV Screening  Never done   DTaP/Tdap/Td (1 - Tdap) Never done   Zoster Vaccines- Shingrix (1 of 2) Never done   INFLUENZA VACCINE  05/14/2022    Immunization History  Administered Date(s) Administered   Influenza,inj,Quad PF,6+ Mos 09/12/2021   PFIZER(Purple Top)SARS-COV-2 Vaccination 12/29/2019, 01/19/2020    HPI  Lab Results  Component Value Date   NA 142 09/12/2021   K 4.2 09/12/2021   CO2 25 09/12/2021   GLUCOSE 79 09/12/2021   BUN 14 09/12/2021   CREATININE 0.92 09/12/2021   CALCIUM 9.9 09/12/2021   EGFR 70 09/12/2021   GFRNONAA >60 02/27/2021   Lab Results  Component Value Date   CHOL 264 (H) 09/12/2021   HDL 125 09/12/2021   LDLCALC 132 (H) 09/12/2021   TRIG 44 09/12/2021   CHOLHDL 2.1 09/12/2021   Lab Results  Component Value Date   TSH 1.340 09/12/2021   Lab Results  Component Value Date   HGBA1C 5.6 09/12/2021   Lab Results  Component Value Date   WBC 9.7 09/12/2021   HGB 12.4 09/12/2021   HCT 37.2 09/12/2021   MCV 85 09/12/2021   PLT 225 09/12/2021   Lab Results  Component Value Date   ALT 13 09/12/2021   AST 13 09/12/2021   ALKPHOS 77 09/12/2021   BILITOT 1.0 09/12/2021   No results found for: "25OHVITD2", "25OHVITD3", "VD25OH"   Review of Systems  Constitutional:  Negative for chills, fatigue and fever.  HENT:  Negative for congestion, hearing loss, tinnitus, trouble swallowing and voice change.   Eyes:  Negative for visual disturbance.  Respiratory:  Negative for cough, chest tightness, shortness of breath and  wheezing.   Cardiovascular:  Negative for chest pain, palpitations and leg swelling.  Gastrointestinal:  Negative for abdominal pain, constipation, diarrhea and vomiting.  Endocrine: Negative for polydipsia and polyuria.  Genitourinary:  Negative for dysuria, frequency, genital sores, vaginal bleeding and vaginal discharge.  Musculoskeletal:  Negative for arthralgias, gait problem and joint swelling.  Skin:  Negative for color change and rash.  Neurological:  Negative for dizziness, tremors, light-headedness and headaches.  Hematological:  Negative for adenopathy. Does not bruise/bleed easily.  Psychiatric/Behavioral:  Negative for dysphoric mood and sleep disturbance. The patient is not nervous/anxious.     Patient Active Problem List   Diagnosis Date Noted   Status post hysterectomy 11/22/2020   Dyspareunia, female 08/20/2018   Hyperlipidemia, mixed 03/17/2018   Migraine with aura and without status migrainosus, not intractable 01/07/2018   Status post TAH-BSO 07/24/2016   Menopause 07/18/2015   Vaginal atrophy 07/18/2015    Allergies  Allergen Reactions   Lactose Intolerance (Gi) Diarrhea    Past Surgical History:  Procedure Laterality Date   ABDOMINAL HYSTERECTOMY  2006   with BSO   COLONOSCOPY WITH PROPOFOL N/A 10/28/2016   Procedure: COLONOSCOPY WITH PROPOFOL;  Surgeon: Jonathon Bellows, MD;  Location: [facility] ENDOSCOPY;  Service: Endoscopy;  Laterality: N/A;   LITHOTRIPSY      Social History   Tobacco Use   Smoking  status: Never   Smokeless tobacco: Never  Vaping Use   Vaping Use: Never used  Substance Use Topics   Alcohol use: Yes    Alcohol/week: 0.0 standard drinks of alcohol    Comment: socially   Drug use: No     Medication list has been reviewed and updated.  No outpatient medications have been marked as taking for the 09/16/22 encounter (Office Visit) with Glean Hess, MD.       09/16/2022    8:05 AM 09/12/2021    8:45 AM 09/04/2020   10:09 AM   GAD 7 : Generalized Anxiety Score  Nervous, Anxious, on Edge 0 1 1  Control/stop worrying 0 0 1  Worry too much - different things 0 0 1  Trouble relaxing 0 0 0  Restless 0 0 0  Easily annoyed or irritable 0 0 0  Afraid - awful might happen 0 1 0  Total GAD 7 Score 0 2 3  Anxiety Difficulty Not difficult at all Not difficult at all Somewhat difficult       09/16/2022    8:04 AM 09/12/2021    8:44 AM 09/04/2020   10:09 AM  Depression screen PHQ 2/9  Decreased Interest 1 0 1  Down, Depressed, Hopeless 0 0 1  PHQ - 2 Score 1 0 2  Altered sleeping _0 Tired, decreased energy 0 0 0  Change in appetite 0 1 0  Feeling bad or failure about yourself  0 0 0  Trouble concentrating 1 0 0  Moving slowly or fidgety/restless 0 0 0  Suicidal thoughts 0 0 0  PHQ-9 Score _1 Difficult doing work/chores Not difficult at all Not difficult at all Somewhat difficult    BP Readings from Last 3 Encounters:  09/16/22 126/62  09/12/21 (!) 102/58  02/27/21 (!) 123/55    Physical Exam Vitals and nursing note reviewed.  Constitutional:      General: She is not in acute distress.    Appearance: She is well-developed.  HENT:     Head: Normocephalic and atraumatic.     Right Ear: Tympanic membrane and ear canal normal.     Left Ear: Tympanic membrane and ear canal normal.     Nose:     Right Sinus: No maxillary sinus tenderness.     Left Sinus: No maxillary sinus tenderness.  Eyes:     General: No scleral icterus.       Right eye: No discharge.        Left eye: No discharge.     Conjunctiva/sclera: Conjunctivae normal.  Neck:     Thyroid: No thyromegaly.     Vascular: No carotid bruit.  Cardiovascular:     Rate and Rhythm: Normal rate and regular rhythm.     Pulses: Normal pulses.     Heart sounds: Normal heart sounds.  Pulmonary:     Effort: Pulmonary effort is normal. No respiratory distress.     Breath sounds: No wheezing.  Chest:  Breasts:    Right: No mass, nipple  discharge, skin change or tenderness.     Left: No mass, nipple discharge, skin change or tenderness.  Abdominal:     General: Bowel sounds are normal.     Palpations: Abdomen is soft.     Tenderness: There is no abdominal tenderness.  Musculoskeletal:     Cervical back: Normal range of motion. No erythema.     Right lower leg: No edema.  Left lower leg: No edema.  Lymphadenopathy:     Cervical: No cervical adenopathy.  Skin:    General: Skin is warm and dry.     Capillary Refill: Capillary refill takes less than 2 seconds.     Findings: No rash.  Neurological:     Mental Status: She is alert and oriented to person, place, and time.     Cranial Nerves: No cranial nerve deficit.     Sensory: No sensory deficit.     Deep Tendon Reflexes: Reflexes are normal and symmetric.  Psychiatric:        Attention and Perception: Attention normal.        Mood and Affect: Mood normal.     Wt Readings from Last 3 Encounters:  09/16/22 120 lb 12.8 oz (54.8 kg)  09/12/21 115 lb (52.2 kg)  02/27/21 126 lb 15.8 oz (57.6 kg)    BP 126/62   Pulse 68   Ht _0  (1.6 m)   Wt 120 lb 12.8 oz (54.8 kg)   SpO2 99%   BMI 21.40 kg/m   Assessment and Plan: 1. Annual physical exam Normal exam. Continue healthy diet and exercise. Will get flu vaccine next week. Still considering Covid and Shingrix. - CBC with Differential/Platelet - Comprehensive metabolic panel - TSH  2. Encounter for screening mammogram for breast cancer Schedule at [facility] - MM 3D SCREEN BREAST BILATERAL  3. Hyperlipidemia, mixed - Lipid panel   Partially dictated using Editor, commissioning. Any errors are unintentional.  Halina Maidens, MD [facility]  09/16/2022

## 2022-09-16 NOTE — Addendum Note (Signed)
Addended by: Clista Bernhardt on: 09/16/2022 08:42 AM   Modules accepted: Orders

## 2022-09-17 LAB — CBC WITH DIFFERENTIAL/PLATELET
Basophils Absolute: 0 10*3/uL (ref 0.0–0.2)
Basos: 0 %
EOS (ABSOLUTE): 0 10*3/uL (ref 0.0–0.4)
Eos: 0 %
Hematocrit: 37.7 % (ref 34.0–46.6)
Hemoglobin: 12.5 g/dL (ref 11.1–15.9)
Immature Grans (Abs): 0 10*3/uL (ref 0.0–0.1)
Immature Granulocytes: 0 %
Lymphocytes Absolute: 2.7 10*3/uL (ref 0.7–3.1)
Lymphs: 29 %
MCH: 28.6 pg (ref 26.6–33.0)
MCHC: 33.2 g/dL (ref 31.5–35.7)
MCV: 86 fL (ref 79–97)
Monocytes Absolute: 0.7 10*3/uL (ref 0.1–0.9)
Monocytes: 7 %
Neutrophils Absolute: 6 10*3/uL (ref 1.4–7.0)
Neutrophils: 64 %
Platelets: 229 10*3/uL (ref 150–450)
RBC: 4.37 x10E6/uL (ref 3.77–5.28)
RDW: 12.8 % (ref 11.7–15.4)
WBC: 9.5 10*3/uL (ref 3.4–10.8)

## 2022-09-17 LAB — COMPREHENSIVE METABOLIC PANEL
ALT: 13 IU/L (ref 0–32)
AST: 15 IU/L (ref 0–40)
Albumin/Globulin Ratio: 2 (ref 1.2–2.2)
Albumin: 4.5 g/dL (ref 3.9–4.9)
Alkaline Phosphatase: 88 IU/L (ref 44–121)
BUN/Creatinine Ratio: 12 (ref 12–28)
BUN: 11 mg/dL (ref 8–27)
Bilirubin Total: 1.4 mg/dL — ABNORMAL HIGH (ref 0.0–1.2)
CO2: 23 mmol/L (ref 20–29)
Calcium: 9.9 mg/dL (ref 8.7–10.3)
Chloride: 104 mmol/L (ref 96–106)
Creatinine, Ser: 0.91 mg/dL (ref 0.57–1.00)
Globulin, Total: 2.3 g/dL (ref 1.5–4.5)
Glucose: 88 mg/dL (ref 70–99)
Potassium: 4.2 mmol/L (ref 3.5–5.2)
Sodium: 141 mmol/L (ref 134–144)
Total Protein: 6.8 g/dL (ref 6.0–8.5)
eGFR: 71 mL/min/{1.73_m2} (ref >59)

## 2022-09-17 LAB — LIPID PANEL
Chol/HDL Ratio: 2.2 ratio (ref 0.0–4.4)
Cholesterol, Total: 258 mg/dL — ABNORMAL HIGH (ref 100–199)
HDL: 115 mg/dL (ref >39)
LDL Chol Calc (NIH): 136 mg/dL — ABNORMAL HIGH (ref 0–99)
Triglycerides: 48 mg/dL (ref 0–149)
VLDL Cholesterol Cal: 7 mg/dL (ref 5–40)

## 2022-09-17 LAB — TSH: TSH: 1.27 u[IU]/mL (ref 0.450–4.500)

## 2022-09-25 DIAGNOSIS — J0100 Acute maxillary sinusitis, unspecified: Secondary | ICD-10-CM | POA: Diagnosis not present

## 2022-10-02 ENCOUNTER — Ambulatory Visit (INDEPENDENT_AMBULATORY_CARE_PROVIDER_SITE_OTHER): Payer: BC Managed Care – PPO | Admitting: Internal Medicine

## 2022-10-02 ENCOUNTER — Encounter: Payer: Self-pay | Admitting: Internal Medicine

## 2022-10-02 VITALS — BP 104/62 | HR 80 | Temp 98.3°F | Ht 63.0 in | Wt 122.4 lb

## 2022-10-02 DIAGNOSIS — J0100 Acute maxillary sinusitis, unspecified: Secondary | ICD-10-CM

## 2022-10-02 MED ORDER — AMOXICILLIN-POT CLAVULANATE 875-125 MG PO TABS: 1.0000 | ORAL_TABLET | Freq: Two times a day (BID) | ORAL | 0 refills | Status: AC

## 2022-10-02 NOTE — Progress Notes (Addendum)
Date:  10/02/2022   Name:  Lindsay Howell   DOB:  1960/11/02   MRN:  [id number]   Chief Complaint: Sinusitis (Sinus Pain, Pressure, Congestion. Started 7 days ago. Patient did telehealth visit and was given flonase nasal spray. Tested self for covid and it was negative.)  Sinusitis This is a new problem. The current episode started in the past 7 days. The problem has been gradually worsening since onset. There has been no fever. Associated symptoms include congestion and sinus pressure. Pertinent negatives include no chills or sore throat.    Lab Results  Component Value Date   NA 141 09/16/2022   K 4.2 09/16/2022   CO2 23 09/16/2022   GLUCOSE 88 09/16/2022   BUN 11 09/16/2022   CREATININE 0.91 09/16/2022   CALCIUM 9.9 09/16/2022   EGFR 71 09/16/2022   GFRNONAA >60 02/27/2021   Lab Results  Component Value Date   CHOL 258 (H) 09/16/2022   HDL 115 09/16/2022   LDLCALC 136 (H) 09/16/2022   TRIG 48 09/16/2022   CHOLHDL 2.2 09/16/2022   Lab Results  Component Value Date   TSH 1.270 09/16/2022   Lab Results  Component Value Date   HGBA1C 5.6 09/12/2021   Lab Results  Component Value Date   WBC 9.5 09/16/2022   HGB 12.5 09/16/2022   HCT 37.7 09/16/2022   MCV 86 09/16/2022   PLT 229 09/16/2022   Lab Results  Component Value Date   ALT 13 09/16/2022   AST 15 09/16/2022   ALKPHOS 88 09/16/2022   BILITOT 1.4 (H) 09/16/2022   No results found for: "25OHVITD2", "25OHVITD3", "VD25OH"   Review of Systems  Constitutional:  Negative for chills, fatigue and fever.  HENT:  Positive for congestion, postnasal drip, sinus pressure and sinus pain. Negative for sore throat and trouble swallowing.     Patient Active Problem List   Diagnosis Date Noted   Status post hysterectomy 11/22/2020   Dyspareunia, female 08/20/2018   Hyperlipidemia, mixed 03/17/2018   Migraine with aura and without status migrainosus, not intractable 01/07/2018   Status post TAH-BSO 07/24/2016    Menopause 07/18/2015   Vaginal atrophy 07/18/2015    Allergies  Allergen Reactions   Lactose Intolerance (Gi) Diarrhea    Past Surgical History:  Procedure Laterality Date   ABDOMINAL HYSTERECTOMY  2006   with BSO   COLONOSCOPY WITH PROPOFOL N/A 10/28/2016   Procedure: COLONOSCOPY WITH PROPOFOL;  Surgeon: Jonathon Bellows, MD;  Location: [facility] ENDOSCOPY;  Service: Endoscopy;  Laterality: N/A;   LITHOTRIPSY      Social History   Tobacco Use   Smoking status: Never   Smokeless tobacco: Never  Vaping Use   Vaping Use: Never used  Substance Use Topics   Alcohol use: Yes    Alcohol/week: 0.0 standard drinks of alcohol    Comment: socially   Drug use: No     Medication list has been reviewed and updated.  Current Meds  Medication Sig   amoxicillin-clavulanate (AUGMENTIN) 875-125 MG tablet Take 1 tablet by mouth 2 (two) times daily for 10 days.   Multiple Vitamin (MULTIVITAMIN) tablet Take 1 tablet by mouth daily.       10/02/2022    4:13 PM 09/16/2022    8:05 AM 09/12/2021    8:45 AM 09/04/2020   10:09 AM  GAD 7 : Generalized Anxiety Score  Nervous, Anxious, on Edge 0 0 1 1  Control/stop worrying 0 0 0 1  Worry too much -  different things 0 0 0 1  Trouble relaxing 0 0 0 0  Restless 0 0 0 0  Easily annoyed or irritable 0 0 0 0  Afraid - awful might happen 0 0 1 0  Total GAD 7 Score 0 0 2 3  Anxiety Difficulty Not difficult at all Not difficult at all Not difficult at all Somewhat difficult       10/02/2022    4:13 PM 09/16/2022    8:04 AM 09/12/2021    8:44 AM  Depression screen PHQ 2/9  Decreased Interest 0 1 0  Down, Depressed, Hopeless 0 0 0  PHQ - 2 Score 0 1 0  Altered sleeping _0 Tired, decreased energy 0 0 0  Change in appetite 0 0 1  Feeling bad or failure about yourself  0 0 0  Trouble concentrating 0 1 0  Moving slowly or fidgety/restless 0 0 0  Suicidal thoughts 0 0 0  PHQ-9 Score _1 Difficult doing work/chores Not difficult at all Not  difficult at all Not difficult at all    BP Readings from Last 3 Encounters:  10/02/22 104/62  09/16/22 126/62  09/12/21 (!) 102/58    Physical Exam Constitutional:      Appearance: Normal appearance.  HENT:     Right Ear: Tympanic membrane and ear canal normal.     Left Ear: Tympanic membrane and ear canal normal.     Nose:     Right Sinus: Maxillary sinus tenderness and frontal sinus tenderness present.     Left Sinus: Maxillary sinus tenderness and frontal sinus tenderness present.     Mouth/Throat:     Pharynx: Posterior oropharyngeal erythema (and mucoid drainage) present.     Tonsils: Tonsillar exudate (small tonsil stones) present.  Cardiovascular:     Rate and Rhythm: Normal rate and regular rhythm.  Pulmonary:     Effort: Pulmonary effort is normal.     Breath sounds: Normal breath sounds. No decreased breath sounds or wheezing.  Neurological:     Mental Status: She is alert.     Wt Readings from Last 3 Encounters:  10/02/22 122 lb 6.4 oz (55.5 kg)  09/16/22 120 lb 12.8 oz (54.8 kg)  09/12/21 115 lb (52.2 kg)    BP 104/62   Pulse 80   Temp 98.3 F (36.8 C) (Oral)   Ht _2  (1.6 m)   Wt 122 lb 6.4 oz (55.5 kg)   BMI 21.68 kg/m   Assessment and Plan: Problem List Items Addressed This Visit   None Visit Diagnoses     Acute non-recurrent maxillary sinusitis    -  Primary   Stop Iptratropium nasal spray - too drying continue fluids to promote loose mucus follow up if no improvement   Relevant Medications   amoxicillin-clavulanate (AUGMENTIN) 875-125 MG tablet        Partially dictated using Editor, commissioning. Any errors are unintentional.  Halina Maidens, MD [facility]  10/02/2022

## 2022-10-03 ENCOUNTER — Ambulatory Visit
Admission: RE | Admit: 2022-10-03 | Discharge: 2022-10-03 | Disposition: A | Discharge status: Home / Self Care | Payer: BC Managed Care – PPO | Source: Ambulatory Visit | Attending: Internal Medicine | Admitting: Internal Medicine

## 2022-10-03 DIAGNOSIS — Z1231 Encounter for screening mammogram for malignant neoplasm of breast: Secondary | ICD-10-CM | POA: Diagnosis not present

## 2023-07-16 ENCOUNTER — Encounter: Payer: Self-pay | Admitting: Internal Medicine

## 2023-07-16 ENCOUNTER — Ambulatory Visit (INDEPENDENT_AMBULATORY_CARE_PROVIDER_SITE_OTHER): Payer: BC Managed Care – PPO | Admitting: Internal Medicine

## 2023-07-16 VITALS — BP 104/70 | HR 89 | Ht 63.0 in | Wt 124.0 lb

## 2023-07-16 DIAGNOSIS — N3000 Acute cystitis without hematuria: Secondary | ICD-10-CM | POA: Diagnosis not present

## 2023-07-16 LAB — POCT URINALYSIS DIPSTICK
Bilirubin, UA: NEGATIVE
Blood, UA: NEGATIVE
Glucose, UA: NEGATIVE
Ketones, UA: NEGATIVE
Leukocytes, UA: NEGATIVE
Nitrite, UA: NEGATIVE
Protein, UA: NEGATIVE
Spec Grav, UA: 1.015 (ref 1.010–1.025)
Urobilinogen, UA: 0.2 U/dL
pH, UA: 5 (ref 5.0–8.0)

## 2023-07-16 MED ORDER — CIPROFLOXACIN HCL 250 MG PO TABS: 250.0000 mg | ORAL_TABLET | Freq: Two times a day (BID) | ORAL | 0 refills | Status: AC

## 2023-07-16 NOTE — Progress Notes (Signed)
Date:  07/16/2023   Name:  Quincy Prisco   DOB:  05/09/1961   MRN:  [id number]   Chief Complaint: Urinary Tract Infection (Started 3 days ago. Discomfort while urinating, and foul urine. Drinking more water and cranberry juice. Urgency, and burning.)  Urinary Tract Infection  This is a new problem. Episode onset: 3 days ago. The problem has been gradually worsening. The quality of the pain is described as burning. The pain is mild. There has been no fever. Associated symptoms include frequency and urgency. Pertinent negatives include no chills, flank pain, hematuria, nausea or vomiting. She has tried increased fluids for the symptoms. The treatment provided significant relief.    Lab Results  Component Value Date   NA 141 09/16/2022   K 4.2 09/16/2022   CO2 23 09/16/2022   GLUCOSE 88 09/16/2022   BUN 11 09/16/2022   CREATININE 0.91 09/16/2022   CALCIUM 9.9 09/16/2022   EGFR 71 09/16/2022   GFRNONAA >60 02/27/2021   Lab Results  Component Value Date   CHOL 258 (H) 09/16/2022   HDL 115 09/16/2022   LDLCALC 136 (H) 09/16/2022   TRIG 48 09/16/2022   CHOLHDL 2.2 09/16/2022   Lab Results  Component Value Date   TSH 1.270 09/16/2022   Lab Results  Component Value Date   HGBA1C 5.6 09/12/2021   Lab Results  Component Value Date   WBC 9.5 09/16/2022   HGB 12.5 09/16/2022   HCT 37.7 09/16/2022   MCV 86 09/16/2022   PLT 229 09/16/2022   Lab Results  Component Value Date   ALT 13 09/16/2022   AST 15 09/16/2022   ALKPHOS 88 09/16/2022   BILITOT 1.4 (H) 09/16/2022   No results found for: "25OHVITD2", "25OHVITD3", "VD25OH"   Review of Systems  Constitutional:  Negative for chills and fever.  Gastrointestinal:  Negative for nausea and vomiting.  Genitourinary:  Positive for dysuria (terminal intermittently), frequency and urgency. Negative for flank pain and hematuria.  Psychiatric/Behavioral:  Negative for dysphoric mood and sleep disturbance. The patient is not  nervous/anxious.     Patient Active Problem List   Diagnosis Date Noted   Status post hysterectomy 11/22/2020   Dyspareunia, female 08/20/2018   Hyperlipidemia, mixed 03/17/2018   Migraine with aura and without status migrainosus, not intractable 01/07/2018   Status post TAH-BSO 07/24/2016   Menopause 07/18/2015   Vaginal atrophy 07/18/2015    Allergies  Allergen Reactions   Lactose Intolerance (Gi) Diarrhea    Past Surgical History:  Procedure Laterality Date   ABDOMINAL HYSTERECTOMY  2006   with BSO   COLONOSCOPY WITH PROPOFOL N/A 10/28/2016   Procedure: COLONOSCOPY WITH PROPOFOL;  Surgeon: Wyline Mood, MD;  Location: [facility];  Service: Endoscopy;  Laterality: N/A;   LITHOTRIPSY      Social History   Tobacco Use   Smoking status: Never   Smokeless tobacco: Never  Vaping Use   Vaping status: Never Used  Substance Use Topics   Alcohol use: Yes    Alcohol/week: 0.0 standard drinks of alcohol    Comment: socially   Drug use: No     Medication list has been reviewed and updated.  Current Meds  Medication Sig   ciprofloxacin (CIPRO) 250 MG tablet Take 1 tablet (250 mg total) by mouth 2 (two) times daily for 3 days.   Multiple Vitamin (MULTIVITAMIN) tablet Take 1 tablet by mouth daily.       07/16/2023   10:29 AM 10/02/2022  4:13 PM 09/16/2022    8:05 AM 09/12/2021    8:45 AM  GAD 7 : Generalized Anxiety Score  Nervous, Anxious, on Edge 1 0 0 1  Control/stop worrying 0 0 0 0  Worry too much - different things 1 0 0 0  Trouble relaxing 0 0 0 0  Restless 0 0 0 0  Easily annoyed or irritable 0 0 0 0  Afraid - awful might happen 0 0 0 1  Total GAD 7 Score 2 0 0 2  Anxiety Difficulty Not difficult at all Not difficult at all Not difficult at all Not difficult at all       07/16/2023   10:29 AM 10/02/2022    4:13 PM 09/16/2022    8:04 AM  Depression screen PHQ 2/9  Decreased Interest 0 0 1  Down, Depressed, Hopeless 0 0 0  PHQ - 2 Score 0 0 1   Altered sleeping 1 1 1   Tired, decreased energy 0 0 0  Change in appetite 0 0 0  Feeling bad or failure about yourself  0 0 0  Trouble concentrating 0 0 1  Moving slowly or fidgety/restless 0 0 0  Suicidal thoughts 0 0 0  PHQ-9 Score 1 1 3   Difficult doing work/chores Not difficult at all Not difficult at all Not difficult at all    BP Readings from Last 3 Encounters:  07/16/23 104/70  10/02/22 104/62  09/16/22 126/62    Physical Exam Vitals and nursing note reviewed.  Constitutional:      General: She is not in acute distress.    Appearance: She is well-developed.  Cardiovascular:     Rate and Rhythm: Normal rate and regular rhythm.     Heart sounds: Normal heart sounds.  Pulmonary:     Effort: Pulmonary effort is normal. No respiratory distress.     Breath sounds: Normal breath sounds.  Abdominal:     General: Bowel sounds are normal.     Palpations: Abdomen is soft.     Tenderness: There is no abdominal tenderness. There is no right CVA tenderness, left CVA tenderness, guarding or rebound.  Neurological:     Mental Status: She is alert.     Wt Readings from Last 3 Encounters:  07/16/23 124 lb (56.2 kg)  10/02/22 122 lb 6.4 oz (55.5 kg)  09/16/22 120 lb 12.8 oz (54.8 kg)    BP 104/70   Pulse 89   Ht 5\' 3"  (1.6 m)   Wt 124 lb (56.2 kg)   SpO2 98%   BMI 21.97 kg/m   Assessment and Plan:  Problem List Items Addressed This Visit   None Visit Diagnoses     Acute cystitis without hematuria    -  Primary   highly suspicious for early UTI continue fluids, cranberry juice will treat presumptively with Cipro x 3 days   Relevant Medications   ciprofloxacin (CIPRO) 250 MG tablet   Other Relevant Orders   POCT urinalysis dipstick (Completed)       No follow-ups on file.    Reubin Milan, MD [facility] [place]

## 2023-09-19 ENCOUNTER — Encounter: Payer: Self-pay | Admitting: Internal Medicine

## 2023-09-19 ENCOUNTER — Ambulatory Visit (INDEPENDENT_AMBULATORY_CARE_PROVIDER_SITE_OTHER): Payer: BC Managed Care – PPO | Admitting: Internal Medicine

## 2023-09-19 VITALS — BP 112/68 | HR 65 | Ht 63.0 in | Wt 122.0 lb

## 2023-09-19 DIAGNOSIS — E782 Mixed hyperlipidemia: Secondary | ICD-10-CM | POA: Diagnosis not present

## 2023-09-19 DIAGNOSIS — G43109 Migraine with aura, not intractable, without status migrainosus: Secondary | ICD-10-CM | POA: Diagnosis not present

## 2023-09-19 DIAGNOSIS — Z0000 Encounter for general adult medical examination without abnormal findings: Secondary | ICD-10-CM

## 2023-09-19 DIAGNOSIS — R17 Unspecified jaundice: Secondary | ICD-10-CM

## 2023-09-19 DIAGNOSIS — Z131 Encounter for screening for diabetes mellitus: Secondary | ICD-10-CM

## 2023-09-19 DIAGNOSIS — Z1231 Encounter for screening mammogram for malignant neoplasm of breast: Secondary | ICD-10-CM | POA: Diagnosis not present

## 2023-09-19 NOTE — Progress Notes (Signed)
Date:  09/19/2023   Name:  Lindsay Howell   DOB:  Oct 18, 1960   MRN:  [id number]   Chief Complaint: Annual Exam Lindsay Howell is a 62 y.o. female who presents today for her Complete Annual Exam. She feels well. She reports exercising karate 4 days a week. She reports she is sleeping well. Breast complaints none.  Mammogram: 09/2022 DEXA: none Colonoscopy: 2018 repeat 10 yrs Pap: d/c'd  Health Maintenance Due  Topic Date Due   HIV Screening  Never done   DTaP/Tdap/Td (1 - Tdap) Never done   Zoster Vaccines- Shingrix (1 of 2) Never done   COVID-19 Vaccine (3 - 2023-24 season) 06/15/2023    Immunization History  Administered Date(s) Administered   Influenza,inj,Quad PF,6+ Mos 09/12/2021   PFIZER(Purple Top)SARS-COV-2 Vaccination 12/29/2019, 01/19/2020     HPI  Review of Systems  Constitutional:  Negative for fatigue and unexpected weight change.  HENT:  Negative for hearing loss and trouble swallowing.   Eyes:  Negative for visual disturbance.  Respiratory:  Negative for cough, chest tightness, shortness of breath and wheezing.   Cardiovascular:  Negative for chest pain, palpitations and leg swelling.  Gastrointestinal:  Negative for abdominal pain, constipation and diarrhea.  Genitourinary:  Negative for dysuria and urgency.  Neurological:  Negative for dizziness, weakness, light-headedness and headaches.  Psychiatric/Behavioral:  Negative for dysphoric mood and sleep disturbance. The patient is not nervous/anxious.      Lab Results  Component Value Date   NA 141 09/16/2022   K 4.2 09/16/2022   CO2 23 09/16/2022   GLUCOSE 88 09/16/2022   BUN 11 09/16/2022   CREATININE 0.91 09/16/2022   CALCIUM 9.9 09/16/2022   EGFR 71 09/16/2022   GFRNONAA >60 02/27/2021   Lab Results  Component Value Date   CHOL 258 (H) 09/16/2022   HDL 115 09/16/2022   LDLCALC 136 (H) 09/16/2022   TRIG 48 09/16/2022   CHOLHDL 2.2 09/16/2022   Lab Results  Component Value Date    TSH 1.270 09/16/2022   Lab Results  Component Value Date   HGBA1C 5.6 09/12/2021   Lab Results  Component Value Date   WBC 9.5 09/16/2022   HGB 12.5 09/16/2022   HCT 37.7 09/16/2022   MCV 86 09/16/2022   PLT 229 09/16/2022   Lab Results  Component Value Date   ALT 13 09/16/2022   AST 15 09/16/2022   ALKPHOS 88 09/16/2022   BILITOT 1.4 (H) 09/16/2022   No results found for: "25OHVITD2", "25OHVITD3", "VD25OH"   Patient Active Problem List   Diagnosis Date Noted   Status post hysterectomy 11/22/2020   Dyspareunia, female 08/20/2018   Hyperlipidemia, mixed 03/17/2018   Migraine with aura and without status migrainosus, not intractable 01/07/2018   Status post TAH-BSO 07/24/2016   Menopause 07/18/2015   Vaginal atrophy 07/18/2015    Allergies  Allergen Reactions   Lactose Intolerance (Gi) Diarrhea    Past Surgical History:  Procedure Laterality Date   ABDOMINAL HYSTERECTOMY  2006   with BSO   COLONOSCOPY WITH PROPOFOL N/A 10/28/2016   Procedure: COLONOSCOPY WITH PROPOFOL;  Surgeon: Wyline Mood, MD;  Location: [facility] ENDOSCOPY;  Service: Endoscopy;  Laterality: N/A;   LITHOTRIPSY      Social History   Tobacco Use   Smoking status: Never   Smokeless tobacco: Never  Vaping Use   Vaping status: Never Used  Substance Use Topics   Alcohol use: Yes    Alcohol/week: 0.0 standard drinks of alcohol  Comment: socially   Drug use: No     Medication list has been reviewed and updated.  Current Meds  Medication Sig   Multiple Vitamin (MULTIVITAMIN) tablet Take 1 tablet by mouth daily.       09/19/2023    7:58 AM 07/16/2023   10:29 AM 10/02/2022    4:13 PM 09/16/2022    8:05 AM  GAD 7 : Generalized Anxiety Score  Nervous, Anxious, on Edge 0 1 0 0  Control/stop worrying 0 0 0 0  Worry too much - different things 0 1 0 0  Trouble relaxing 0 0 0 0  Restless 0 0 0 0  Easily annoyed or irritable 0 0 0 0  Afraid - awful might happen 0 0 0 0  Total GAD 7 Score 0 2  0 0  Anxiety Difficulty Not difficult at all Not difficult at all Not difficult at all Not difficult at all       09/19/2023    7:58 AM 07/16/2023   10:29 AM 10/02/2022    4:13 PM  Depression screen PHQ 2/9  Decreased Interest 0 0 0  Down, Depressed, Hopeless 0 0 0  PHQ - 2 Score 0 0 0  Altered sleeping 1 1 1   Tired, decreased energy 0 0 0  Change in appetite 0 0 0  Feeling bad or failure about yourself  0 0 0  Trouble concentrating 0 0 0  Moving slowly or fidgety/restless 0 0 0  Suicidal thoughts 0 0 0  PHQ-9 Score 1 1 1   Difficult doing work/chores Not difficult at all Not difficult at all Not difficult at all    BP Readings from Last 3 Encounters:  09/19/23 112/68  07/16/23 104/70  10/02/22 104/62    Physical Exam Vitals and nursing note reviewed.  Constitutional:      General: She is not in acute distress.    Appearance: She is well-developed.  HENT:     Head: Normocephalic and atraumatic.     Right Ear: Tympanic membrane and ear canal normal.     Left Ear: Tympanic membrane and ear canal normal.     Nose:     Right Sinus: No maxillary sinus tenderness.     Left Sinus: No maxillary sinus tenderness.  Eyes:     General: No scleral icterus.       Right eye: No discharge.        Left eye: No discharge.     Conjunctiva/sclera: Conjunctivae normal.  Neck:     Thyroid: No thyromegaly.     Vascular: No carotid bruit.  Cardiovascular:     Rate and Rhythm: Normal rate and regular rhythm.     Pulses: Normal pulses.     Heart sounds: Normal heart sounds.  Pulmonary:     Effort: Pulmonary effort is normal. No respiratory distress.     Breath sounds: No wheezing.  Abdominal:     General: Bowel sounds are normal.     Palpations: Abdomen is soft.     Tenderness: There is no abdominal tenderness.  Musculoskeletal:     Cervical back: Normal range of motion. No erythema.     Right lower leg: No edema.     Left lower leg: No edema.  Lymphadenopathy:     Cervical: No  cervical adenopathy.  Skin:    General: Skin is warm and dry.     Findings: No rash.  Neurological:     Mental Status: She is alert and oriented  to person, place, and time.     Cranial Nerves: No cranial nerve deficit.     Sensory: No sensory deficit.     Deep Tendon Reflexes: Reflexes are normal and symmetric.  Psychiatric:        Attention and Perception: Attention normal.        Mood and Affect: Mood normal.     Wt Readings from Last 3 Encounters:  09/19/23 122 lb (55.3 kg)  07/16/23 124 lb (56.2 kg)  10/02/22 122 lb 6.4 oz (55.5 kg)    BP 112/68   Pulse 65   Ht 5\' 3"  (1.6 m)   Wt 122 lb (55.3 kg)   SpO2 100%   BMI 21.61 kg/m   Assessment and Plan:  Problem List Items Addressed This Visit       Unprioritized   Migraine with aura and without status migrainosus, not intractable   Hyperlipidemia, mixed    Mild hyperlipidemia managed with diet and exercise.  10 yr risk is low. Lab Results  Component Value Date   LDLCALC 136 (H) 09/16/2022         Relevant Orders   Lipid panel   Other Visit Diagnoses     Annual physical exam    -  Primary   Recommend annual flu vaccine and Shingrix vaccines   Relevant Orders   CBC with Differential/Platelet   Comprehensive metabolic panel   Hemoglobin A1c   Lipid panel   TSH   Encounter for screening mammogram for breast cancer       Relevant Orders   MM 3D SCREENING MAMMOGRAM BILATERAL BREAST   Screening for diabetes mellitus       Relevant Orders   Comprehensive metabolic panel   Hemoglobin A1c   Elevated bilirubin       Relevant Orders   Comprehensive metabolic panel       Return in about 1 year (around 09/18/2024) for CPX.    Reubin Milan, MD [facility] [place]

## 2023-09-19 NOTE — Patient Instructions (Signed)
Call [facility] to schedule your mammogram at [phone].  

## 2023-09-19 NOTE — Assessment & Plan Note (Addendum)
Mild hyperlipidemia managed with diet and exercise.  10 yr risk is low. Lab Results  Component Value Date   LDLCALC 136 (H) 09/16/2022

## 2023-09-22 DIAGNOSIS — E782 Mixed hyperlipidemia: Secondary | ICD-10-CM | POA: Diagnosis not present

## 2023-09-22 DIAGNOSIS — Z0000 Encounter for general adult medical examination without abnormal findings: Secondary | ICD-10-CM | POA: Diagnosis not present

## 2023-09-22 DIAGNOSIS — Z131 Encounter for screening for diabetes mellitus: Secondary | ICD-10-CM | POA: Diagnosis not present

## 2023-09-22 DIAGNOSIS — R17 Unspecified jaundice: Secondary | ICD-10-CM | POA: Diagnosis not present

## 2023-09-23 LAB — COMPREHENSIVE METABOLIC PANEL
ALT: 18 [IU]/L (ref 0–32)
AST: 17 [IU]/L (ref 0–40)
Albumin: 4.5 g/dL (ref 3.9–4.9)
Alkaline Phosphatase: 107 [IU]/L (ref 44–121)
BUN/Creatinine Ratio: 14 (ref 12–28)
BUN: 13 mg/dL (ref 8–27)
Bilirubin Total: 1.3 mg/dL — ABNORMAL HIGH (ref 0.0–1.2)
CO2: 23 mmol/L (ref 20–29)
Calcium: 9.9 mg/dL (ref 8.7–10.3)
Chloride: 105 mmol/L (ref 96–106)
Creatinine, Ser: 0.9 mg/dL (ref 0.57–1.00)
Globulin, Total: 2.4 g/dL (ref 1.5–4.5)
Glucose: 80 mg/dL (ref 70–99)
Potassium: 4.2 mmol/L (ref 3.5–5.2)
Sodium: 145 mmol/L — ABNORMAL HIGH (ref 134–144)
Total Protein: 6.9 g/dL (ref 6.0–8.5)
eGFR: 71 mL/min/{1.73_m2} (ref >59)

## 2023-09-23 LAB — CBC WITH DIFFERENTIAL/PLATELET
Basophils Absolute: 0 10*3/uL (ref 0.0–0.2)
Basos: 1 %
EOS (ABSOLUTE): 0.1 10*3/uL (ref 0.0–0.4)
Eos: 1 %
Hematocrit: 39.5 % (ref 34.0–46.6)
Hemoglobin: 12.9 g/dL (ref 11.1–15.9)
Immature Grans (Abs): 0 10*3/uL (ref 0.0–0.1)
Immature Granulocytes: 0 %
Lymphocytes Absolute: 2.8 10*3/uL (ref 0.7–3.1)
Lymphs: 38 %
MCH: 28.6 pg (ref 26.6–33.0)
MCHC: 32.7 g/dL (ref 31.5–35.7)
MCV: 88 fL (ref 79–97)
Monocytes Absolute: 0.6 10*3/uL (ref 0.1–0.9)
Monocytes: 9 %
Neutrophils Absolute: 3.8 10*3/uL (ref 1.4–7.0)
Neutrophils: 51 %
Platelets: 218 10*3/uL (ref 150–450)
RBC: 4.51 x10E6/uL (ref 3.77–5.28)
RDW: 13.2 % (ref 11.7–15.4)
WBC: 7.4 10*3/uL (ref 3.4–10.8)

## 2023-09-23 LAB — HEMOGLOBIN A1C
Est. average glucose Bld gHb Est-mCnc: 108 mg/dL
Hgb A1c MFr Bld: 5.4 % (ref 4.8–5.6)

## 2023-09-23 LAB — TSH: TSH: 2.3 u[IU]/mL (ref 0.450–4.500)

## 2023-09-23 LAB — LIPID PANEL
Chol/HDL Ratio: 2.1 {ratio} (ref 0.0–4.4)
Cholesterol, Total: 278 mg/dL — ABNORMAL HIGH (ref 100–199)
HDL: 131 mg/dL (ref >39)
LDL Chol Calc (NIH): 139 mg/dL — ABNORMAL HIGH (ref 0–99)
Triglycerides: 57 mg/dL (ref 0–149)
VLDL Cholesterol Cal: 8 mg/dL (ref 5–40)

## 2023-10-21 ENCOUNTER — Ambulatory Visit
Admission: RE | Admit: 2023-10-21 | Discharge: 2023-10-21 | Disposition: A | Discharge status: Home / Self Care | Payer: BC Managed Care – PPO | Source: Ambulatory Visit | Attending: Internal Medicine | Admitting: Internal Medicine

## 2023-10-21 DIAGNOSIS — Z1231 Encounter for screening mammogram for malignant neoplasm of breast: Secondary | ICD-10-CM | POA: Diagnosis not present

## 2023-10-23 ENCOUNTER — Ambulatory Visit: Payer: BC Managed Care – PPO

## 2024-04-01 DIAGNOSIS — H524 Presbyopia: Secondary | ICD-10-CM | POA: Diagnosis not present

## 2024-04-01 DIAGNOSIS — D239 Other benign neoplasm of skin, unspecified: Secondary | ICD-10-CM | POA: Diagnosis not present

## 2024-04-01 DIAGNOSIS — D23121 Other benign neoplasm of skin of left upper eyelid, including canthus: Secondary | ICD-10-CM | POA: Diagnosis not present

## 2024-04-01 DIAGNOSIS — H5213 Myopia, bilateral: Secondary | ICD-10-CM | POA: Diagnosis not present

## 2024-04-02 DIAGNOSIS — S00262A Insect bite (nonvenomous) of left eyelid and periocular area, initial encounter: Secondary | ICD-10-CM | POA: Diagnosis not present

## 2024-09-20 ENCOUNTER — Encounter: Payer: Self-pay | Admitting: Internal Medicine

## 2024-09-20 ENCOUNTER — Ambulatory Visit: Payer: Self-pay | Admitting: Internal Medicine

## 2024-09-20 VITALS — BP 116/70 | HR 65 | Ht 63.0 in | Wt 123.0 lb

## 2024-09-20 DIAGNOSIS — Z1231 Encounter for screening mammogram for malignant neoplasm of breast: Secondary | ICD-10-CM

## 2024-09-20 DIAGNOSIS — Z23 Encounter for immunization: Secondary | ICD-10-CM | POA: Diagnosis not present

## 2024-09-20 DIAGNOSIS — E782 Mixed hyperlipidemia: Secondary | ICD-10-CM | POA: Diagnosis not present

## 2024-09-20 DIAGNOSIS — E870 Hyperosmolality and hypernatremia: Secondary | ICD-10-CM

## 2024-09-20 DIAGNOSIS — G43109 Migraine with aura, not intractable, without status migrainosus: Secondary | ICD-10-CM | POA: Diagnosis not present

## 2024-09-20 DIAGNOSIS — E2839 Other primary ovarian failure: Secondary | ICD-10-CM

## 2024-09-20 NOTE — Assessment & Plan Note (Signed)
 Lipids managed with lifestyle changes. Lab Results  Component Value Date   LDLCALC 139 (H) 09/22/2023   Will recheck 10 yr risk and advise.

## 2024-09-20 NOTE — Assessment & Plan Note (Signed)
 No recent change in migraine headache character or frequency. Headaches continue to respond well to current therapy with otc medications. No change in regimen recommended at this time.

## 2024-09-20 NOTE — Progress Notes (Signed)
 Date:  09/20/2024   Name:  Lindsay Howell   DOB:  1961-02-26   MRN:  [id number]   Chief Complaint: Annual Exam Lindsay Howell is a 63 y.o. female who presents today for her Complete Annual Exam. She feels well. She reports exercising. She reports she is sleeping well. Breast complaints - none.  Health Maintenance  Topic Date Due   Medicare Annual Wellness Visit  Never done   HIV Screening  Never done   DTaP/Tdap/Td vaccine (1 - Tdap) Never done   Zoster (Shingles) Vaccine (1 of 2) Never done   Osteoporosis screening with Bone Density Scan  Never done   COVID-19 Vaccine (3 - 2025-26 season) 06/14/2024   Breast Cancer Screening  10/20/2024   Flu Shot  01/11/2025*   Colon Cancer Screening  10/28/2026   Pneumococcal Vaccine for age over 12  Completed   Hepatitis C Screening  Completed   Hepatitis B Vaccine  Aged Out   Meningitis B Vaccine  Aged Out  *Topic was postponed. The date shown is not the original due date.    Migraine  This is a recurrent problem. The problem occurs intermittently. The pain quality is similar to prior headaches. Pertinent negatives include no abdominal pain, coughing, dizziness or weakness.    Review of Systems  Constitutional:  Negative for fatigue and unexpected weight change.  HENT:  Negative for trouble swallowing.   Eyes:  Negative for visual disturbance.  Respiratory:  Negative for cough, chest tightness, shortness of breath and wheezing.   Cardiovascular:  Negative for chest pain, palpitations and leg swelling.  Gastrointestinal:  Negative for abdominal pain, constipation and diarrhea.  Musculoskeletal:  Negative for arthralgias and myalgias.  Neurological:  Negative for dizziness, weakness, light-headedness and headaches.  Psychiatric/Behavioral:  Negative for dysphoric mood and sleep disturbance. The patient is not nervous/anxious.      Lab Results  Component Value Date   NA 145 (H) 09/22/2023   K 4.2 09/22/2023   CO2 23 09/22/2023    GLUCOSE 80 09/22/2023   BUN 13 09/22/2023   CREATININE 0.90 09/22/2023   CALCIUM 9.9 09/22/2023   EGFR 71 09/22/2023   GFRNONAA >60 02/27/2021   Lab Results  Component Value Date   CHOL 278 (H) 09/22/2023   HDL 131 09/22/2023   LDLCALC 139 (H) 09/22/2023   TRIG 57 09/22/2023   CHOLHDL 2.1 09/22/2023   Lab Results  Component Value Date   TSH 2.300 09/22/2023   Lab Results  Component Value Date   HGBA1C 5.4 09/22/2023   Lab Results  Component Value Date   WBC 7.4 09/22/2023   HGB 12.9 09/22/2023   HCT 39.5 09/22/2023   MCV 88 09/22/2023   PLT 218 09/22/2023   Lab Results  Component Value Date   ALT 18 09/22/2023   AST 17 09/22/2023   ALKPHOS 107 09/22/2023   BILITOT 1.3 (H) 09/22/2023   No results found for: MARIEN BOLLS, VD25OH   Patient Active Problem List   Diagnosis Date Noted   Status post hysterectomy 11/22/2020   Dyspareunia, female 08/20/2018   Hyperlipidemia, mixed 03/17/2018   Migraine with aura and without status migrainosus, not intractable 01/07/2018   Menopause 07/18/2015   Vaginal atrophy 07/18/2015    Allergies  Allergen Reactions   Lactose Intolerance (Gi) Diarrhea    Past Surgical History:  Procedure Laterality Date   ABDOMINAL HYSTERECTOMY  2006   with BSO   COLONOSCOPY WITH PROPOFOL  N/A 10/28/2016   Procedure:  COLONOSCOPY WITH PROPOFOL ;  Surgeon: Ruel Kung, MD;  Location: [facility];  Service: Endoscopy;  Laterality: N/A;   LITHOTRIPSY      Social History   Tobacco Use   Smoking status: Never   Smokeless tobacco: Never  Vaping Use   Vaping status: Never Used  Substance Use Topics   Alcohol use: Yes    Alcohol/week: 0.0 standard drinks of alcohol    Comment: socially   Drug use: No     Medication list has been reviewed and updated.  Current Meds  Medication Sig   Multiple Vitamin (MULTIVITAMIN) tablet Take 1 tablet by mouth daily.       09/20/2024    7:52 AM 09/19/2023    7:58 AM 07/16/2023    10:29 AM 10/02/2022    4:13 PM  GAD 7 : Generalized Anxiety Score  Nervous, Anxious, on Edge 0 0 1 0  Control/stop worrying 0 0 0 0  Worry too much - different things 0 0 1 0  Trouble relaxing 0 0 0 0  Restless 0 0 0 0  Easily annoyed or irritable 0 0 0 0  Afraid - awful might happen 0 0 0 0  Total GAD 7 Score 0 0 2 0  Anxiety Difficulty Not difficult at all Not difficult at all Not difficult at all Not difficult at all       09/20/2024    7:52 AM 09/19/2023    7:58 AM 07/16/2023   10:29 AM  Depression screen PHQ 2/9  Decreased Interest 0 0 0  Down, Depressed, Hopeless 0 0 0  PHQ - 2 Score 0 0 0  Altered sleeping 0 1 1  Tired, decreased energy 0 0 0  Change in appetite 0 0 0  Feeling bad or failure about yourself  0 0 0  Trouble concentrating 0 0 0  Moving slowly or fidgety/restless 0 0 0  Suicidal thoughts 0 0 0  PHQ-9 Score 0 1  1   Difficult doing work/chores Not difficult at all Not difficult at all Not difficult at all     Data saved with a previous flowsheet row definition    BP Readings from Last 3 Encounters:  09/20/24 116/70  09/19/23 112/68  07/16/23 104/70    Physical Exam Vitals and nursing note reviewed.  Constitutional:      General: She is not in acute distress.    Appearance: She is well-developed.  HENT:     Head: Normocephalic and atraumatic.     Right Ear: Tympanic membrane and ear canal normal.     Left Ear: Tympanic membrane and ear canal normal.     Nose:     Right Sinus: No maxillary sinus tenderness.     Left Sinus: No maxillary sinus tenderness.  Eyes:     General: No scleral icterus.       Right eye: No discharge.        Left eye: No discharge.     Conjunctiva/sclera: Conjunctivae normal.  Neck:     Thyroid: No thyromegaly.     Vascular: No carotid bruit.  Cardiovascular:     Rate and Rhythm: Normal rate and regular rhythm.     Pulses: Normal pulses.     Heart sounds: Normal heart sounds.  Pulmonary:     Effort: Pulmonary  effort is normal. No respiratory distress.     Breath sounds: No wheezing.  Abdominal:     General: Bowel sounds are normal.     Palpations:  Abdomen is soft.     Tenderness: There is no abdominal tenderness.  Musculoskeletal:     Cervical back: Normal range of motion. No erythema.     Right lower leg: No edema.     Left lower leg: No edema.  Lymphadenopathy:     Cervical: No cervical adenopathy.  Skin:    General: Skin is warm and dry.     Capillary Refill: Capillary refill takes less than 2 seconds.     Findings: No rash.  Neurological:     Mental Status: She is alert and oriented to person, place, and time.     Cranial Nerves: No cranial nerve deficit.     Sensory: No sensory deficit.     Deep Tendon Reflexes: Reflexes are normal and symmetric.  Psychiatric:        Attention and Perception: Attention normal.        Mood and Affect: Mood normal.     Wt Readings from Last 3 Encounters:  09/20/24 123 lb (55.8 kg)  09/19/23 122 lb (55.3 kg)  07/16/23 124 lb (56.2 kg)    BP 116/70   Pulse 65   Ht 5' 3 (1.6 m)   Wt 123 lb (55.8 kg)   SpO2 98%   BMI 21.79 kg/m   Assessment and Plan:  Problem List Items Addressed This Visit       Unprioritized   Migraine with aura and without status migrainosus, not intractable   No recent change in migraine headache character or frequency. Headaches continue to respond well to current therapy with otc medications. No change in regimen recommended at this time.         Relevant Orders   CBC with Differential/Platelet   Hyperlipidemia, mixed - Primary   Lipids managed with lifestyle changes. Lab Results  Component Value Date   LDLCALC 139 (H) 09/22/2023   Will recheck 10 yr risk and advise.      Relevant Orders   Lipid panel   TSH   Other Visit Diagnoses       Encounter for screening mammogram for breast cancer       Relevant Orders   MM 3D SCREENING MAMMOGRAM BILATERAL BREAST     Ovarian failure       Relevant  Orders   DG Bone Density     Hypernatremia       Relevant Orders   Comprehensive metabolic panel with GFR   Urinalysis, Routine w reflex microscopic     Encounter for immunization       Relevant Orders   Pneumococcal conjugate vaccine 20-valent (Completed)       No follow-ups on file.    Lindsay HILARIO Adie, MD [facility] [place]

## 2024-09-20 NOTE — Patient Instructions (Signed)
 Call [facility] to schedule your mammogram and Bone Density at [phone].

## 2024-09-21 ENCOUNTER — Ambulatory Visit: Payer: Self-pay | Admitting: Internal Medicine

## 2024-09-21 LAB — URINALYSIS, ROUTINE W REFLEX MICROSCOPIC
Bilirubin, UA: NEGATIVE
Glucose, UA: NEGATIVE
Ketones, UA: NEGATIVE
Nitrite, UA: NEGATIVE
Protein,UA: NEGATIVE
RBC, UA: NEGATIVE
Specific Gravity, UA: 1.019 (ref 1.005–1.030)
Urobilinogen, Ur: 0.2 mg/dL (ref 0.2–1.0)
pH, UA: 6 (ref 5.0–7.5)

## 2024-09-21 LAB — COMPREHENSIVE METABOLIC PANEL WITH GFR
ALT: 12 IU/L (ref 0–32)
AST: 16 IU/L (ref 0–40)
Albumin: 4.7 g/dL (ref 3.9–4.9)
Alkaline Phosphatase: 100 IU/L (ref 49–135)
BUN/Creatinine Ratio: 13 (ref 12–28)
BUN: 13 mg/dL (ref 8–27)
Bilirubin Total: 1.2 mg/dL (ref 0.0–1.2)
CO2: 22 mmol/L (ref 20–29)
Calcium: 10.9 mg/dL — ABNORMAL HIGH (ref 8.7–10.3)
Chloride: 103 mmol/L (ref 96–106)
Creatinine, Ser: 0.98 mg/dL (ref 0.57–1.00)
Globulin, Total: 2.4 g/dL (ref 1.5–4.5)
Glucose: 84 mg/dL (ref 70–99)
Potassium: 4.2 mmol/L (ref 3.5–5.2)
Sodium: 141 mmol/L (ref 134–144)
Total Protein: 7.1 g/dL (ref 6.0–8.5)
eGFR: 64 mL/min/1.73 (ref >59)

## 2024-09-21 LAB — TSH: TSH: 1.71 u[IU]/mL (ref 0.450–4.500)

## 2024-09-21 LAB — CBC WITH DIFFERENTIAL/PLATELET
Basophils Absolute: 0 x10E3/uL (ref 0.0–0.2)
Basos: 0 %
EOS (ABSOLUTE): 0.1 x10E3/uL (ref 0.0–0.4)
Eos: 1 %
Hematocrit: 41.4 % (ref 34.0–46.6)
Hemoglobin: 13.2 g/dL (ref 11.1–15.9)
Immature Grans (Abs): 0.1 x10E3/uL (ref 0.0–0.1)
Immature Granulocytes: 1 %
Lymphocytes Absolute: 2.6 x10E3/uL (ref 0.7–3.1)
Lymphs: 28 %
MCH: 28.8 pg (ref 26.6–33.0)
MCHC: 31.9 g/dL (ref 31.5–35.7)
MCV: 90 fL (ref 79–97)
Monocytes Absolute: 0.6 x10E3/uL (ref 0.1–0.9)
Monocytes: 6 %
Neutrophils Absolute: 6 x10E3/uL (ref 1.4–7.0)
Neutrophils: 64 %
Platelets: 236 x10E3/uL (ref 150–450)
RBC: 4.58 x10E6/uL (ref 3.77–5.28)
RDW: 13.1 % (ref 11.7–15.4)
WBC: 9.4 x10E3/uL (ref 3.4–10.8)

## 2024-09-21 LAB — LIPID PANEL
Chol/HDL Ratio: 2.2 ratio (ref 0.0–4.4)
Cholesterol, Total: 265 mg/dL — ABNORMAL HIGH (ref 100–199)
HDL: 119 mg/dL (ref >39)
LDL Chol Calc (NIH): 139 mg/dL — ABNORMAL HIGH (ref 0–99)
Triglycerides: 49 mg/dL (ref 0–149)
VLDL Cholesterol Cal: 7 mg/dL (ref 5–40)

## 2024-09-21 LAB — MICROSCOPIC EXAMINATION
Bacteria, UA: NONE SEEN
Casts: NONE SEEN /LPF
RBC, Urine: NONE SEEN /HPF (ref 0–2)

## 2024-09-22 ENCOUNTER — Telehealth: Payer: Self-pay

## 2024-09-22 ENCOUNTER — Other Ambulatory Visit: Payer: Self-pay

## 2024-09-22 DIAGNOSIS — Z13820 Encounter for screening for osteoporosis: Secondary | ICD-10-CM

## 2024-09-22 DIAGNOSIS — Z1231 Encounter for screening mammogram for malignant neoplasm of breast: Secondary | ICD-10-CM

## 2024-09-22 NOTE — Telephone Encounter (Signed)
 Orders entered

## 2024-09-22 NOTE — Telephone Encounter (Signed)
 Copied from CRM #[id number]. Topic: Clinical - Request for Lab/Test Order >> Sep 22, 2024  9:05 AM Lindsay Howell wrote: Reason for CRM:  Lindsay Howell needs for Dr. Lemon to write orders for her bone density and mammogram because Dr. Justus is retiring. The request is coming from [facility]. Thanks

## 2024-09-24 ENCOUNTER — Other Ambulatory Visit: Payer: Self-pay | Admitting: Student

## 2024-09-24 DIAGNOSIS — Z780 Asymptomatic menopausal state: Secondary | ICD-10-CM

## 2024-09-24 DIAGNOSIS — Z13820 Encounter for screening for osteoporosis: Secondary | ICD-10-CM

## 2024-10-22 ENCOUNTER — Encounter

## 2024-11-02 ENCOUNTER — Ambulatory Visit
Admission: RE | Admit: 2024-11-02 | Discharge: 2024-11-02 | Disposition: A | Discharge status: Home / Self Care | Source: Ambulatory Visit | Attending: Student | Admitting: Student

## 2024-11-02 ENCOUNTER — Ambulatory Visit
Admission: RE | Admit: 2024-11-02 | Discharge: 2024-11-02 | Disposition: A | Source: Ambulatory Visit | Attending: Student

## 2024-11-02 DIAGNOSIS — Z13820 Encounter for screening for osteoporosis: Secondary | ICD-10-CM | POA: Diagnosis present

## 2024-11-02 DIAGNOSIS — Z1231 Encounter for screening mammogram for malignant neoplasm of breast: Secondary | ICD-10-CM | POA: Diagnosis present

## 2024-11-02 DIAGNOSIS — Z780 Asymptomatic menopausal state: Secondary | ICD-10-CM | POA: Insufficient documentation

## 2024-11-03 ENCOUNTER — Ambulatory Visit: Payer: Self-pay | Admitting: Student

## 2024-11-03 NOTE — Progress Notes (Signed)
 Spoke with patient she verbalized understanding. She is wanting to know if there is a way she can get calcium supplement that does not make her constipated. She is also lactose intolerant.

## 2024-11-03 NOTE — Progress Notes (Signed)
 Please let me know if there is an alternative way for patient to get Calcium other than supplement ot dairy. Thank you.  JM

## 2024-11-09 NOTE — Progress Notes (Signed)
 Spoke with patient and informed her.

## 2024-11-12 ENCOUNTER — Ambulatory Visit (INDEPENDENT_AMBULATORY_CARE_PROVIDER_SITE_OTHER): Admitting: Student

## 2024-11-12 ENCOUNTER — Encounter: Payer: Self-pay | Admitting: Student

## 2024-11-12 VITALS — BP 90/50 | HR 79 | Temp 98.5°F | Ht 63.0 in | Wt 124.5 lb

## 2024-11-12 DIAGNOSIS — M8580 Other specified disorders of bone density and structure, unspecified site: Secondary | ICD-10-CM | POA: Insufficient documentation

## 2024-11-12 DIAGNOSIS — M8589 Other specified disorders of bone density and structure, multiple sites: Secondary | ICD-10-CM | POA: Diagnosis not present

## 2024-11-12 MED ORDER — AMOXICILLIN-POT CLAVULANATE 875-125 MG PO TABS: 1.0000 | ORAL_TABLET | Freq: Two times a day (BID) | ORAL | 0 refills | Status: AC

## 2025-09-21 ENCOUNTER — Encounter: Admitting: Student

## 2025-09-21 ENCOUNTER — Encounter: Admitting: Family Medicine
